# Patient Record
Sex: Female | Born: 1970 | Race: Black or African American | Hispanic: No | Marital: Married | Smoking: Former smoker
Health system: Southern US, Community
[De-identification: ages and names within clinical notes are randomized; demographics above are authoritative.]

## PROBLEM LIST (undated history)

## (undated) DIAGNOSIS — I10 Essential (primary) hypertension: Secondary | ICD-10-CM

## (undated) DIAGNOSIS — F32A Depression, unspecified: Secondary | ICD-10-CM

## (undated) DIAGNOSIS — Z720 Tobacco use: Secondary | ICD-10-CM

## (undated) DIAGNOSIS — F419 Anxiety disorder, unspecified: Secondary | ICD-10-CM

## (undated) DIAGNOSIS — R519 Headache, unspecified: Secondary | ICD-10-CM

---

## 2004-10-19 ENCOUNTER — Emergency Department: Payer: Self-pay | Admitting: Emergency Medicine

## 2006-06-28 ENCOUNTER — Emergency Department: Payer: Self-pay | Admitting: Emergency Medicine

## 2006-11-12 ENCOUNTER — Emergency Department: Payer: Self-pay | Admitting: Emergency Medicine

## 2007-02-03 ENCOUNTER — Observation Stay: Payer: Self-pay | Admitting: Otolaryngology

## 2008-02-27 ENCOUNTER — Other Ambulatory Visit: Payer: Self-pay

## 2008-02-27 ENCOUNTER — Emergency Department: Payer: Self-pay | Admitting: Emergency Medicine

## 2009-07-12 ENCOUNTER — Emergency Department: Payer: Self-pay | Admitting: Emergency Medicine

## 2009-12-18 ENCOUNTER — Emergency Department: Payer: Self-pay | Admitting: Emergency Medicine

## 2010-08-19 ENCOUNTER — Emergency Department: Payer: Self-pay | Admitting: Emergency Medicine

## 2011-07-07 ENCOUNTER — Emergency Department: Payer: Self-pay | Admitting: *Deleted

## 2012-01-12 ENCOUNTER — Emergency Department: Payer: Self-pay | Admitting: Emergency Medicine

## 2012-08-03 ENCOUNTER — Emergency Department: Payer: Self-pay | Admitting: Emergency Medicine

## 2013-06-24 ENCOUNTER — Emergency Department: Payer: Self-pay | Admitting: Emergency Medicine

## 2013-06-25 LAB — COMPREHENSIVE METABOLIC PANEL
Albumin: 3.5 g/dL (ref 3.4–5.0)
BUN: 11 mg/dL (ref 7–18)
Bilirubin,Total: 0.2 mg/dL (ref 0.2–1.0)
Calcium, Total: 8.7 mg/dL (ref 8.5–10.1)
Chloride: 106 mmol/L (ref 98–107)
Co2: 26 mmol/L (ref 21–32)
Creatinine: 0.83 mg/dL (ref 0.60–1.30)
EGFR (African American): >60
EGFR (Non-African Amer.): >60
Osmolality: 276 (ref 275–301)
Potassium: 3 mmol/L — ABNORMAL LOW (ref 3.5–5.1)
SGOT(AST): 25 U/L (ref 15–37)
Total Protein: 7.2 g/dL (ref 6.4–8.2)

## 2013-06-25 LAB — URINALYSIS, COMPLETE
Bilirubin,UR: NEGATIVE
Glucose,UR: NEGATIVE mg/dL (ref 0–75)
Ketone: NEGATIVE
Ph: 5 (ref 4.5–8.0)
RBC,UR: 301 /HPF (ref 0–5)
Specific Gravity: 1.005 (ref 1.003–1.030)
Squamous Epithelial: 2

## 2013-06-25 LAB — TROPONIN I: Troponin-I: <0.02 ng/mL

## 2013-06-25 LAB — CBC
HCT: 41 % (ref 35.0–47.0)
HGB: 13.9 g/dL (ref 12.0–16.0)
MCHC: 34 g/dL (ref 32.0–36.0)
MCV: 88 fL (ref 80–100)
Platelet: 304 10*3/uL (ref 150–440)
RBC: 4.69 10*6/uL (ref 3.80–5.20)
RDW: 14.1 % (ref 11.5–14.5)
WBC: 9.2 10*3/uL (ref 3.6–11.0)

## 2013-10-23 ENCOUNTER — Emergency Department: Payer: Self-pay | Admitting: Emergency Medicine

## 2013-10-23 LAB — BASIC METABOLIC PANEL
Anion Gap: 5 — ABNORMAL LOW (ref 7–16)
BUN: 12 mg/dL (ref 7–18)
CALCIUM: 9 mg/dL (ref 8.5–10.1)
CO2: 24 mmol/L (ref 21–32)
Chloride: 105 mmol/L (ref 98–107)
Creatinine: 0.79 mg/dL (ref 0.60–1.30)
EGFR (African American): >60
GLUCOSE: 89 mg/dL (ref 65–99)
Osmolality: 267 (ref 275–301)
Potassium: 3.6 mmol/L (ref 3.5–5.1)
Sodium: 134 mmol/L — ABNORMAL LOW (ref 136–145)

## 2013-10-23 LAB — CBC
HCT: 44.2 % (ref 35.0–47.0)
HGB: 14.7 g/dL (ref 12.0–16.0)
MCH: 29 pg (ref 26.0–34.0)
MCHC: 33.2 g/dL (ref 32.0–36.0)
MCV: 88 fL (ref 80–100)
Platelet: 262 10*3/uL (ref 150–440)
RBC: 5.05 10*6/uL (ref 3.80–5.20)
RDW: 14.2 % (ref 11.5–14.5)
WBC: 8.9 10*3/uL (ref 3.6–11.0)

## 2013-10-23 LAB — TROPONIN I

## 2014-07-22 ENCOUNTER — Emergency Department: Payer: Self-pay | Admitting: Emergency Medicine

## 2015-04-28 IMAGING — CR DG CHEST 2V
1 series · 2 of 2 positions shown · non-contrast
Comparison: Chest radiograph June 25, 2013

CLINICAL DATA: Heavy breathing, elevated blood pressure.

EXAM:
CHEST  2 VIEW

[Series 1: w chest pa · 0.14mm/px · 2 of 2 slices shown]
[im 1/2]
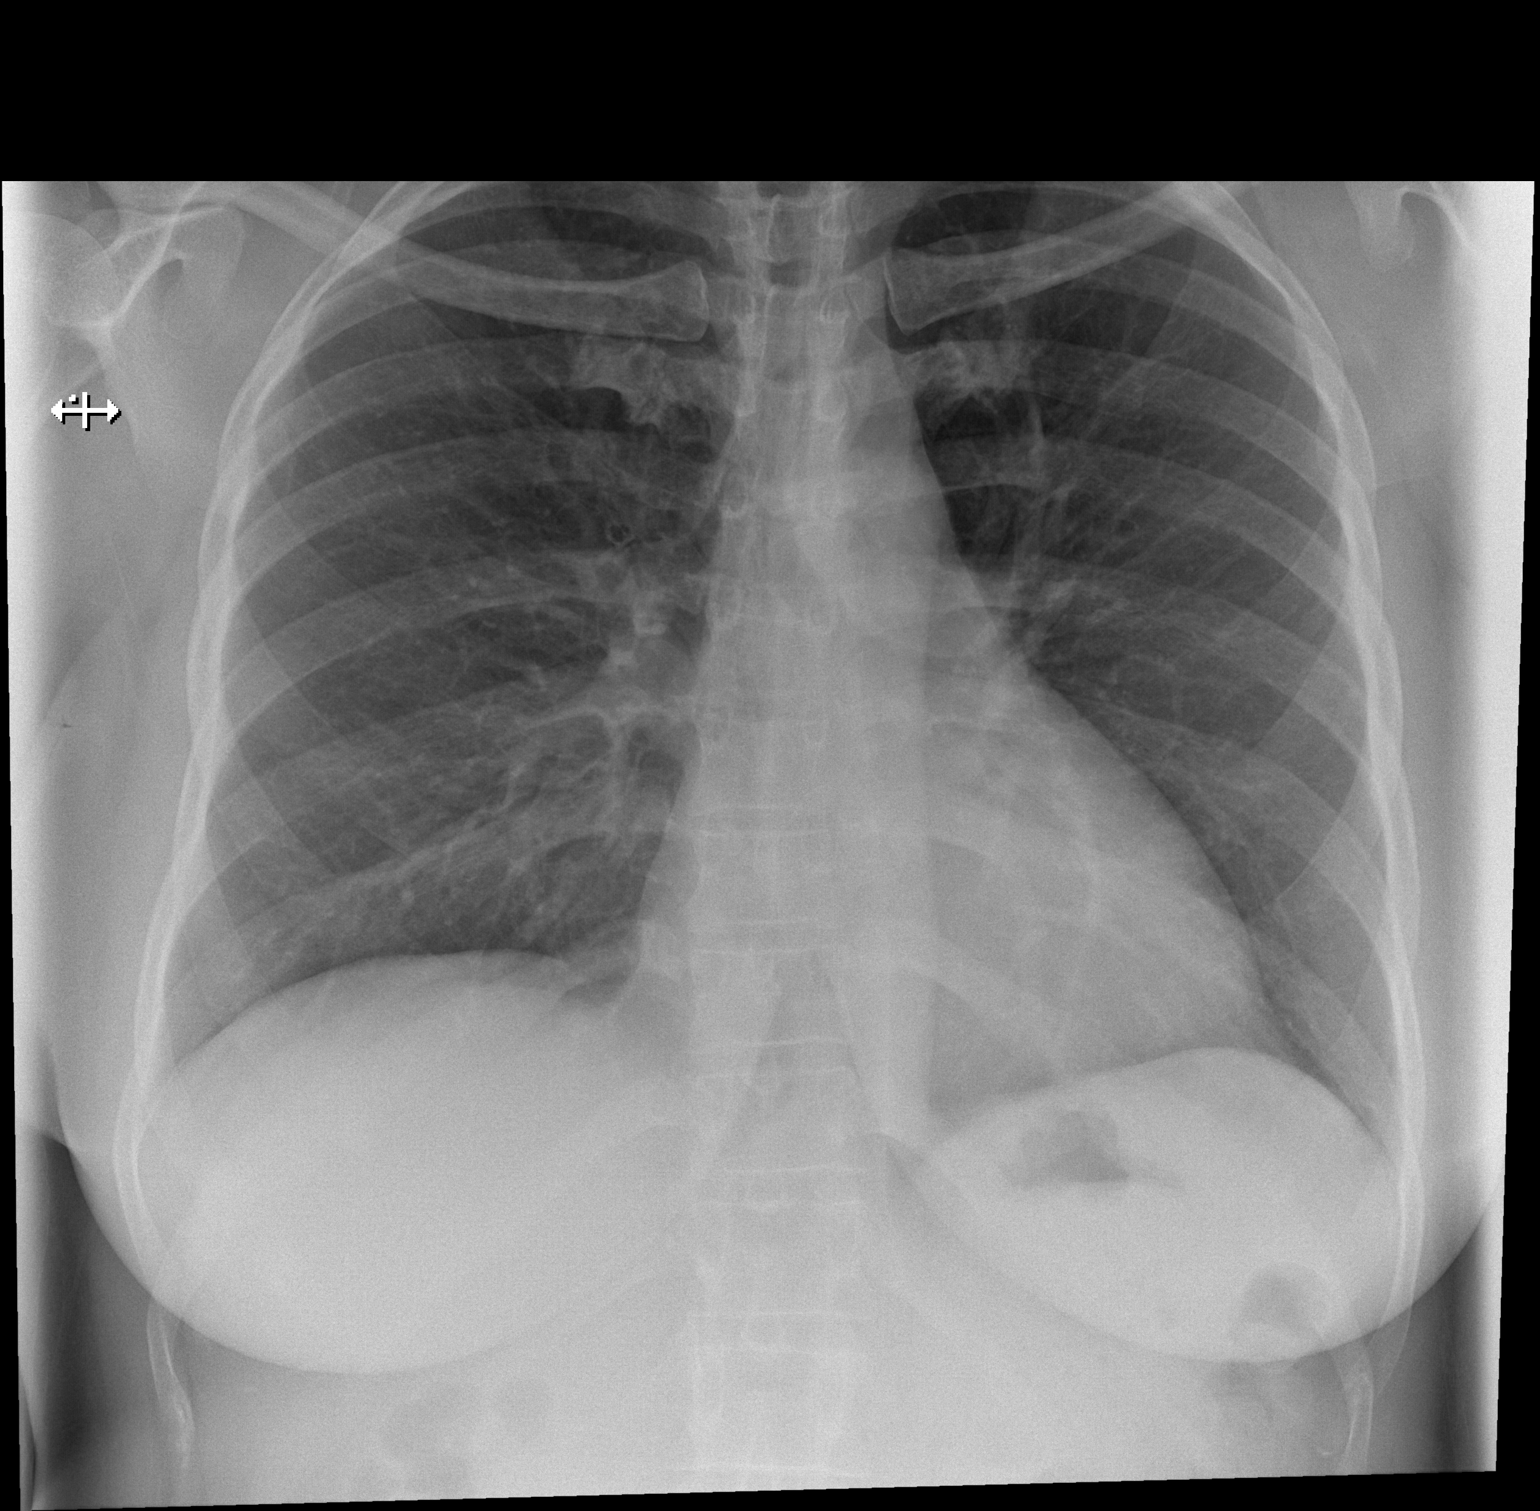
[im 2/2]
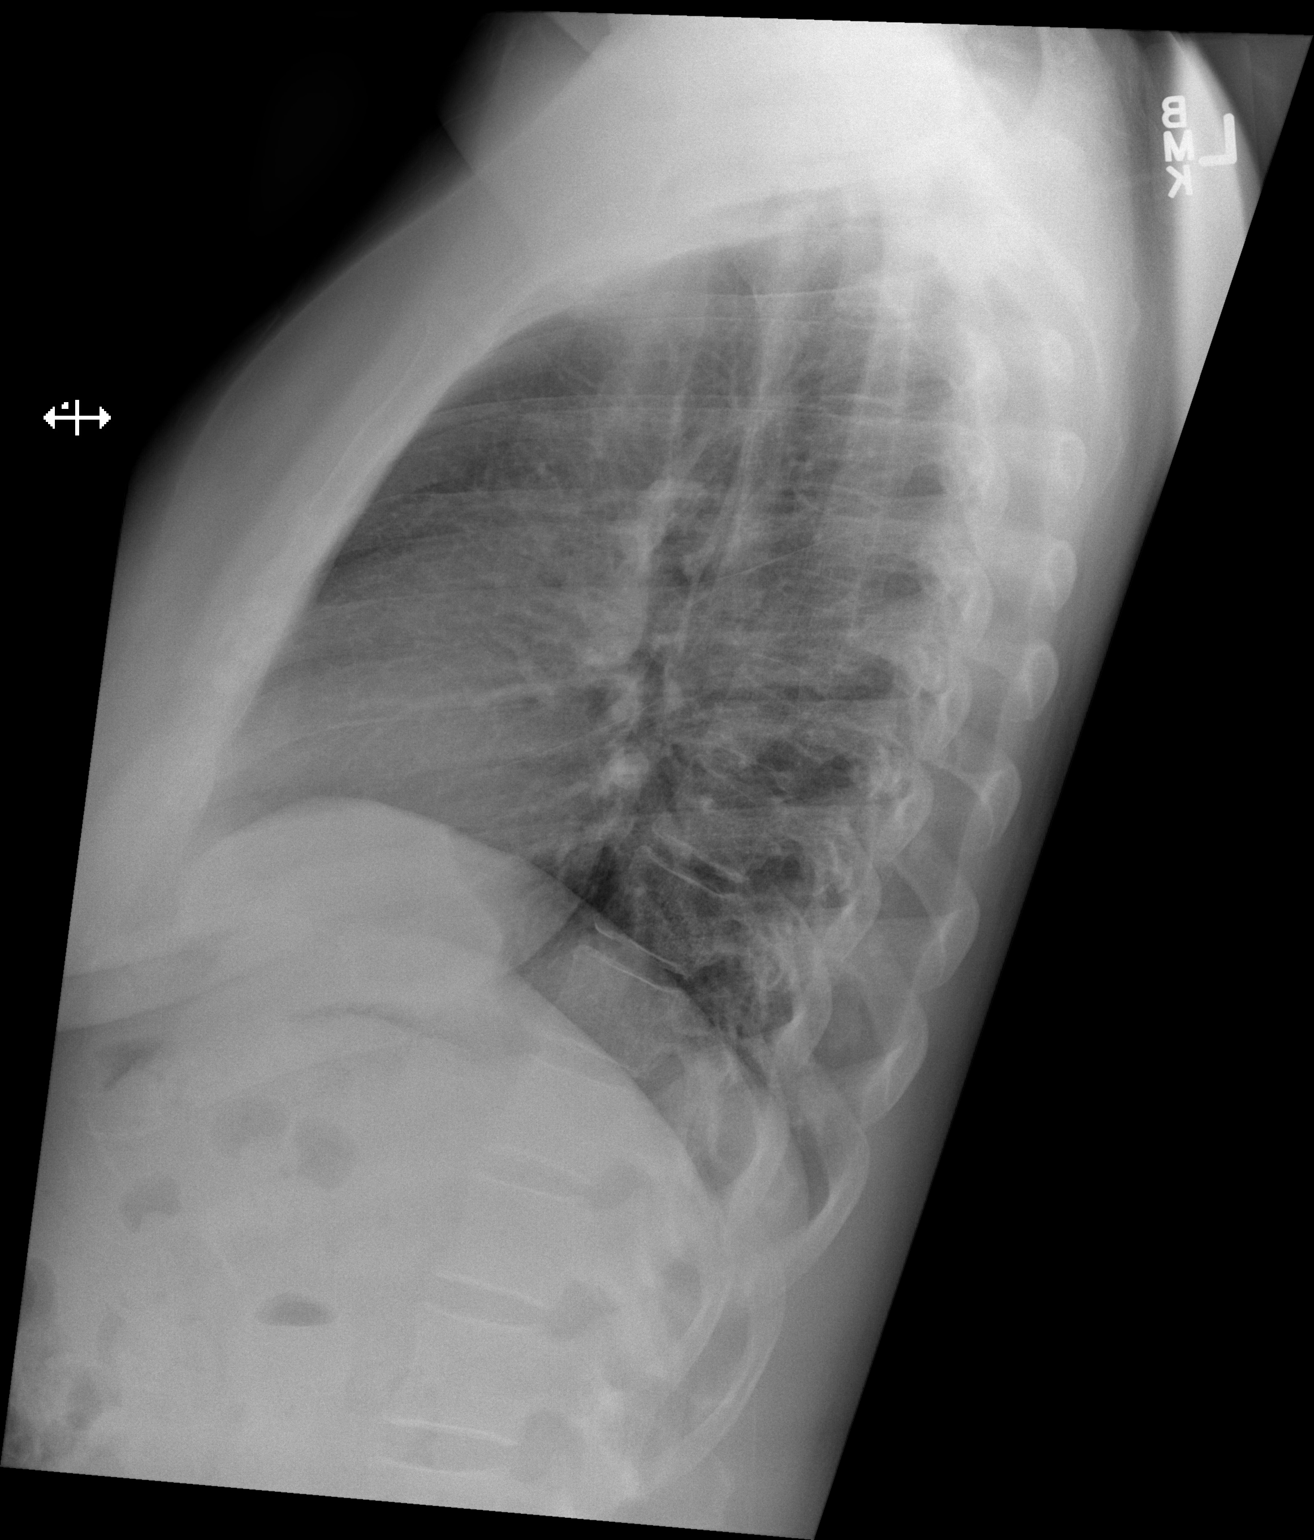

[2 of 2 positions shown; findings below may reference images not displayed]

FINDINGS: The cardiac silhouette appears mildly enlarged, mediastinal
silhouette is unremarkable. The lungs are clear without pleural
effusions or focal consolidations. Pulmonary vasculature is
unremarkable. Trachea projects midline and there is no pneumothorax.
Soft tissue planes and included osseous structures are
nonsuspicious.
IMPRESSION: Mild cardiomegaly, no acute pulmonary process; overall stable
appearance of the chest from June 25, 2013.

  By: Jhonn Galaviz

## 2016-03-16 ENCOUNTER — Encounter: Payer: Self-pay | Admitting: Emergency Medicine

## 2016-03-16 ENCOUNTER — Emergency Department
Admission: EM | Admit: 2016-03-16 | Discharge: 2016-03-16 | Disposition: A | Discharge status: Home / Self Care | Payer: Self-pay | Attending: Emergency Medicine | Admitting: Emergency Medicine

## 2016-03-16 DIAGNOSIS — F1290 Cannabis use, unspecified, uncomplicated: Secondary | ICD-10-CM | POA: Insufficient documentation

## 2016-03-16 DIAGNOSIS — Z5321 Procedure and treatment not carried out due to patient leaving prior to being seen by health care provider: Secondary | ICD-10-CM | POA: Insufficient documentation

## 2016-03-16 DIAGNOSIS — Z87891 Personal history of nicotine dependence: Secondary | ICD-10-CM | POA: Insufficient documentation

## 2016-03-16 HISTORY — DX: Essential (primary) hypertension: I10

## 2016-03-16 NOTE — ED Notes (Signed)
Notified by Pt Relations that pt is leaving d/t "the long wait" and going to [facility]. MD and Charge RN made aware, pt to be disposed as LWBS.

## 2016-03-16 NOTE — ED Notes (Signed)
Pt presents to ED with c/o frontal HA x5 days; pt reports pain as a pressure behind her forehead. Pt also reports abdominal pain yesterday w/ N/V that has since resolved (pt states it might have been constipation). Pt reports being d/x'd with HTN, but being non-complaint with medications since early 2016.  Pt is A&O, in NAD, talking and laughing with visitor at bedside.

## 2016-03-16 NOTE — ED Notes (Signed)
Patient ambulatory to triage with steady gait, without difficulty or distress noted; pt reports sent from Urgent Care for frontal HA x 5 days with elevated BP; st hx of same and has been off BP meds x year; taking ibuprofen/claritin/goody powder without relief

## 2016-03-16 NOTE — ED Notes (Signed)
Patient sent over from Urgent Care for elevated blood pressure of 240/110. Patient with complaints of a headache as well.

## 2016-03-17 ENCOUNTER — Telehealth: Payer: Self-pay | Admitting: Emergency Medicine

## 2016-03-17 NOTE — ED Notes (Signed)
Called patient due to lwot to inquire about condition and follow up plans.  Person who answered says pt is asleep.  i told him I was calling tocheck on her.  He says she was going to make a doctor appt for her headache.  i told him to have her call me at the ED if she needs help finding follow up.Angela Cain  He agrees.

## 2018-05-08 ENCOUNTER — Other Ambulatory Visit: Payer: Self-pay | Admitting: Primary Care

## 2018-05-08 DIAGNOSIS — D259 Leiomyoma of uterus, unspecified: Secondary | ICD-10-CM

## 2018-05-15 ENCOUNTER — Ambulatory Visit
Admission: RE | Admit: 2018-05-15 | Discharge: 2018-05-15 | Disposition: A | Discharge status: Home / Self Care | Payer: 59 | Source: Ambulatory Visit | Attending: Primary Care | Admitting: Primary Care

## 2018-05-15 DIAGNOSIS — D259 Leiomyoma of uterus, unspecified: Secondary | ICD-10-CM | POA: Diagnosis not present

## 2018-10-26 ENCOUNTER — Telehealth: Payer: Self-pay

## 2018-10-26 ENCOUNTER — Emergency Department: Payer: 59

## 2018-10-26 ENCOUNTER — Other Ambulatory Visit: Payer: Self-pay

## 2018-10-26 ENCOUNTER — Emergency Department
Admission: EM | Admit: 2018-10-26 | Discharge: 2018-10-26 | Disposition: A | Discharge status: Home / Self Care | Payer: 59 | Attending: Emergency Medicine | Admitting: Emergency Medicine

## 2018-10-26 DIAGNOSIS — Z87891 Personal history of nicotine dependence: Secondary | ICD-10-CM | POA: Insufficient documentation

## 2018-10-26 DIAGNOSIS — R053 Chronic cough: Secondary | ICD-10-CM

## 2018-10-26 DIAGNOSIS — R05 Cough: Secondary | ICD-10-CM | POA: Diagnosis not present

## 2018-10-26 DIAGNOSIS — E876 Hypokalemia: Secondary | ICD-10-CM | POA: Insufficient documentation

## 2018-10-26 DIAGNOSIS — I471 Supraventricular tachycardia: Secondary | ICD-10-CM

## 2018-10-26 DIAGNOSIS — I10 Essential (primary) hypertension: Secondary | ICD-10-CM | POA: Diagnosis not present

## 2018-10-26 DIAGNOSIS — R002 Palpitations: Secondary | ICD-10-CM | POA: Diagnosis present

## 2018-10-26 LAB — TSH: TSH: 1.182 u[IU]/mL (ref 0.350–4.500)

## 2018-10-26 LAB — BASIC METABOLIC PANEL
ANION GAP: 8 (ref 5–15)
BUN: 12 mg/dL (ref 6–20)
CALCIUM: 8.6 mg/dL — AB (ref 8.9–10.3)
CO2: 25 mmol/L (ref 22–32)
Chloride: 102 mmol/L (ref 98–111)
Creatinine, Ser: 0.76 mg/dL (ref 0.44–1.00)
GFR calc non Af Amer: >60 mL/min (ref >60)
Glucose, Bld: 124 mg/dL — ABNORMAL HIGH (ref 70–99)
Potassium: 2.6 mmol/L — CL (ref 3.5–5.1)
Sodium: 135 mmol/L (ref 135–145)

## 2018-10-26 LAB — CBC
HCT: 37.2 % (ref 36.0–46.0)
Hemoglobin: 11.7 g/dL — ABNORMAL LOW (ref 12.0–15.0)
MCH: 25.2 pg — ABNORMAL LOW (ref 26.0–34.0)
MCHC: 31.5 g/dL (ref 30.0–36.0)
MCV: 80.2 fL (ref 80.0–100.0)
PLATELETS: 411 10*3/uL — AB (ref 150–400)
RBC: 4.64 MIL/uL (ref 3.87–5.11)
RDW: 16.1 % — ABNORMAL HIGH (ref 11.5–15.5)
WBC: 11.3 10*3/uL — ABNORMAL HIGH (ref 4.0–10.5)
nRBC: 0 % (ref 0.0–0.2)

## 2018-10-26 LAB — POTASSIUM: Potassium: 3.5 mmol/L (ref 3.5–5.1)

## 2018-10-26 LAB — TROPONIN I: Troponin I: <0.03 ng/mL (ref <0.03)

## 2018-10-26 LAB — MAGNESIUM: Magnesium: 2 mg/dL (ref 1.7–2.4)

## 2018-10-26 MED ORDER — POTASSIUM CHLORIDE CRYS ER 20 MEQ PO TBCR
40.0000 meq | EXTENDED_RELEASE_TABLET | Freq: Once | ORAL | Status: AC
  Administered 2018-10-26: 40 meq via ORAL
  Filled 2018-10-26: qty 2

## 2018-10-26 MED ORDER — POTASSIUM CHLORIDE ER 10 MEQ PO TBCR: 40.0000 meq | EXTENDED_RELEASE_TABLET | Freq: Two times a day (BID) | ORAL | 0 refills | Status: DC

## 2018-10-26 MED ORDER — KETOROLAC TROMETHAMINE 10 MG PO TABS
10.0000 mg | ORAL_TABLET | Freq: Once | ORAL | Status: AC
  Administered 2018-10-26: 10 mg via ORAL
  Filled 2018-10-26: qty 1

## 2018-10-26 MED ORDER — SODIUM CHLORIDE 0.9 % IV BOLUS
1000.0000 mL | Freq: Once | INTRAVENOUS | Status: AC
  Administered 2018-10-26: 1000 mL via INTRAVENOUS

## 2018-10-26 MED ORDER — METOPROLOL TARTRATE 25 MG PO TABS: 25.0000 mg | ORAL_TABLET | Freq: Two times a day (BID) | ORAL | 0 refills | Status: DC

## 2018-10-26 MED ORDER — METOPROLOL TARTRATE 5 MG/5ML IV SOLN
10.0000 mg | Freq: Once | INTRAVENOUS | Status: AC
  Administered 2018-10-26: 10 mg via INTRAVENOUS
  Filled 2018-10-26: qty 10

## 2018-10-26 MED ORDER — POTASSIUM CHLORIDE 10 MEQ/100ML IV SOLN
10.0000 meq | INTRAVENOUS | Status: AC
  Administered 2018-10-26 (×3): 10 meq via INTRAVENOUS
  Filled 2018-10-26 (×3): qty 100

## 2018-10-26 NOTE — Telephone Encounter (Signed)
-----   Message from Minna Merritts, MD sent at 10/26/2018  2:07 PM EST ----- Needs new pt appt for tachycardia Rate 150 Thx TG

## 2018-10-26 NOTE — ED Triage Notes (Signed)
Pt reports tachycardia - pt states this is a recureent problem Reports shortness of breath but denies chest pain , dizziness, lightheadedness, N?V

## 2018-10-26 NOTE — ED Provider Notes (Addendum)
[facility] Emergency Department Provider Note  ____________________________________________  Time seen: Approximately 2:01 PM  I have reviewed the triage vital signs and the nursing notes.   HISTORY  Chief Complaint Tachycardia    HPI Angela Cain is a 48 y.o. female with a history of HTN not on medications presenting for palpitations.  The patient reports that she was at work when she felt like she had to sit down because her heart was going too fast.  She does drink sweet tea daily.  She states she has episodes like this 1-2 times per year for the last several years but has never sought medical attention for it.  She did not have any associated lightheadedness, syncope, chest pain, shortness of breath.  She has been under treatment by her PMD for chronic cough, including treatment for infection, seasonal allergies and reflux with persistent cough for the past year, but has no new symptoms from the standpoint.  At this time, the patient is feeling better.  Past Medical History:  Diagnosis Date  . Hypertension     There are no active problems to display for this patient.   History reviewed. No pertinent surgical history.    Allergies Patient has no known allergies.  No family history on file.  Social History Social History   Tobacco Use  . Smoking status: Former Smoker    Types: Cigarettes  . Smokeless tobacco: Never Used  Substance Use Topics  . Alcohol use: Yes    Comment: rarely  . Drug use: Yes    Types: Marijuana    Review of Systems Constitutional: No fever/chills.  No lightheadedness or syncope. Eyes: No visual changes. ENT: No sore throat. No congestion or rhinorrhea. Cardiovascular: Denies chest pain.  Positive palpitations. Respiratory: Denies shortness of breath.  No cough. Gastrointestinal: No abdominal pain.  No nausea, no vomiting.  No diarrhea.  No constipation. Genitourinary: Negative for dysuria. Musculoskeletal:  Negative for back pain.  No lower extremity swelling or calf pain. Skin: Negative for rash. Neurological: Negative for headaches. No focal numbness, tingling or weakness.     ____________________________________________   PHYSICAL EXAM:  VITAL SIGNS: ED Triage Vitals  Enc Vitals Group     BP 10/26/18 1323 (!) 144/98     Pulse Rate 10/26/18 1323 (!) 164     Resp 10/26/18 1323 20     Temp --      Temp src --      SpO2 10/26/18 1323 100 %     Weight 10/26/18 1321 145 lb (65.8 kg)     Height 10/26/18 1321 5\' 3"  (1.6 m)     Head Circumference --      Peak Flow --      Pain Score 10/26/18 1320 0     Pain Loc --      Pain Edu? --      Excl. in [place]? --     Constitutional: Alert and oriented. Answers questions appropriately. Eyes: Conjunctivae are normal.  EOMI. No scleral icterus. Head: Atraumatic. Nose: No congestion/rhinnorhea. Mouth/Throat: Mucous membranes are moist.  Neck: No stridor.  Supple.  No JVD.  No meningismus. Cardiovascular: Normal rate, regular rhythm. No murmurs, rubs or gallops.  Heart rate is 96 on my examination. Respiratory: Normal respiratory effort.  No accessory muscle use or retractions. Lungs CTAB.  No wheezes, rales or ronchi. Gastrointestinal: Soft, nontender and nondistended.  No guarding or rebound.  No peritoneal signs. Musculoskeletal: No LE edema. No ttp in the  calves or palpable cords.  Negative Homan's sign. Neurologic:  A&Ox3.  Speech is clear.  Face and smile are symmetric.  EOMI.  Moves all extremities well. Skin:  Skin is warm, dry and intact. No rash noted. Psychiatric: Mood and affect are normal. Speech and behavior are normal.  Normal judgement  ____________________________________________   LABS (all labs ordered are listed, but only abnormal results are displayed)  Labs Reviewed  CBC - Abnormal; Notable for the following components:      Result Value   WBC 11.3 (*)    Hemoglobin 11.7 (*)    MCH 25.2 (*)    RDW 16.1 (*)     Platelets 411 (*)    All other components within normal limits  BASIC METABOLIC PANEL  TROPONIN I  TSH  POC URINE PREG, ED   ____________________________________________  EKG  ED ECG REPORT I, Anne-Caroline Mariea Clonts, the attending physician, personally viewed and interpreted this ECG.   Date: 10/26/2018  EKG Time: 1320  Rate: 151  Rhythm: SVT; LVH  Axis: normal  Intervals:none  ST&T Change: No STEMI; patient has 0.5 mm of ST elevation in V1 and aVR with 0.5 to 1 mm of ST depression in 2 and 3.  Most consistent with early repolarization.  Repeat EKG: ED ECG REPORT I, Anne-Caroline Mariea Clonts, the attending physician, personally viewed and interpreted this ECG.   Date: 10/26/2018  EKG Time: 14443  Rate: 86  Rhythm: normal sinus rhythm  Axis: normal  Intervals:none  ST&T Change: No STEMI   ____________________________________________  RADIOLOGY  Dg Chest 2 View  Result Date: 10/26/2018 CLINICAL DATA:  Tachycardia.  Shortness of breath EXAM: CHEST - 2 VIEW COMPARISON:  October 23, 2013 FINDINGS: The lungs are clear. The heart size and pulmonary vascularity are normal. No adenopathy. No pneumothorax. There is slight lower thoracic dextroscoliosis. IMPRESSION: No edema or consolidation. Electronically Signed   By: Lowella Grip III M.D.   On: 10/26/2018 13:51    ____________________________________________   PROCEDURES  Procedure(s) performed: None  Procedures  Critical Care performed: Yes, see critical care note(s) ____________________________________________   INITIAL IMPRESSION / ASSESSMENT AND PLAN / ED COURSE  Pertinent labs & imaging results that were available during my care of the patient were reviewed by me and considered in my medical decision making (see chart for details).  48 y.o. female with a history of hypertension not currently treated, caffeine drinker, presenting with 1-2 yearly episodes of fast heart rate.  Today, the patient developed a fast  heart rate without any other red flag symptoms.  On upon arrival to the emergency department, she is hypertensive with fast heart rate in the 150s, most consistent with SVT.  She does have some minimal ST elevations and depressions but no STEMI.  I will plan to evaluate her electrolytes, thyroid panel.  She has spontaneously slowed her rate down into the 90s, and I will give her a beta-blocker to prevent resurgence of her SVT.    I spoke with Dr. Rockey Situ, the cardiologist on-call, who recommends daily metoprolol tartrate twice daily with outpatient evaluation.  I have let the patient know about the follow-up plan and described return precautions.  ----------------------------------------- 2:29 PM on 10/26/2018 -----------------------------------------  The patient's electrolytes have resulted and she does have a potassium of 2.6.  I have ordered oral and IV supplementation, and we will recheck her potassium to make sure it is upward trending.  She will be discharged home with a 4-day course of oral potassium with instructions  to have her PCP or Dr. Rockey Situ recheck her potassium as an outpatient.   CRITICAL CARE Performed by: Eula Listen   Total critical care time: 35 minutes  Critical care time was exclusive of separately billable procedures and treating other patients.  Critical care was necessary to treat or prevent imminent or life-threatening deterioration.  Critical care was time spent personally by me on the following activities: development of treatment plan with patient and/or surrogate as well as nursing, discussions with consultants, evaluation of patient's response to treatment, examination of patient, obtaining history from patient or surrogate, ordering and performing treatments and interventions, ordering and review of laboratory studies, ordering and review of radiographic studies, pulse oximetry and re-evaluation of patient's  condition.   ____________________________________________  FINAL CLINICAL IMPRESSION(S) / ED DIAGNOSES  Final diagnoses:  SVT (supraventricular tachycardia) (HCC)  Chronic cough         NEW MEDICATIONS STARTED DURING THIS VISIT:  New Prescriptions   METOPROLOL TARTRATE (LOPRESSOR) 25 MG TABLET    Take 1 tablet (25 mg total) by mouth 2 (two) times daily.      Eula Listen, MD 10/26/18 1423    Eula Listen, MD 10/26/18 [phone]

## 2018-10-26 NOTE — Telephone Encounter (Signed)
Attempted to schedule new patient appt. No ans no vm . Will reach out another time.

## 2018-10-26 NOTE — ED Notes (Signed)
Date and time results received: 10/26/18 2:26 PM    Test: Potassium Critical Value: 2.6  Name of Provider Notified: Dr. Mariea Clonts

## 2018-10-26 NOTE — Discharge Instructions (Addendum)
Please stop drinking caffeine, including sweet tea.  Start taking metoprolol twice daily to prevent this fast heart rate.  Make an appoint with Dr. Rockey Situ, the cardiologist on-call, for follow-up.  Today, your potassium level was low and will need to be rechecked.  Please take the potassium tablets as prescribed, and have either your primary care physician or Dr. Rockey Situ repeat your potassium test.  Return to the emergency department if you develop severe pain, lightheadedness, palpitations, or any other symptoms concerning to you.

## 2018-10-26 NOTE — ED Notes (Signed)
Only received 1 bag of potassium from pharmacy, contacted pharmacy to check on delay and was told the other bags were ready and they would send another one now.

## 2019-01-30 ENCOUNTER — Encounter

## 2019-01-30 ENCOUNTER — Ambulatory Visit: Payer: 59 | Admitting: Cardiovascular Disease

## 2019-02-25 ENCOUNTER — Emergency Department
Admission: EM | Admit: 2019-02-25 | Discharge: 2019-02-25 | Disposition: A | Discharge status: Home / Self Care | Payer: 59 | Attending: Emergency Medicine | Admitting: Emergency Medicine

## 2019-02-25 ENCOUNTER — Other Ambulatory Visit: Payer: Self-pay

## 2019-02-25 DIAGNOSIS — Z5321 Procedure and treatment not carried out due to patient leaving prior to being seen by health care provider: Secondary | ICD-10-CM | POA: Insufficient documentation

## 2019-02-25 DIAGNOSIS — T7840XA Allergy, unspecified, initial encounter: Secondary | ICD-10-CM | POA: Insufficient documentation

## 2019-02-25 NOTE — ED Triage Notes (Signed)
Pt c/o intermittent swelling of her top and bottom liip over the past 2 weeks,. States it will go down with benadryl but then swell back up. Denies any tongue or throat swelling.

## 2020-04-30 IMAGING — CR DG CHEST 2V
2 series · 2 of 2 positions shown · non-contrast
Comparison: October 23, 2013

CLINICAL DATA: Tachycardia.  Shortness of breath

EXAM:
CHEST - 2 VIEW

[chest pa]
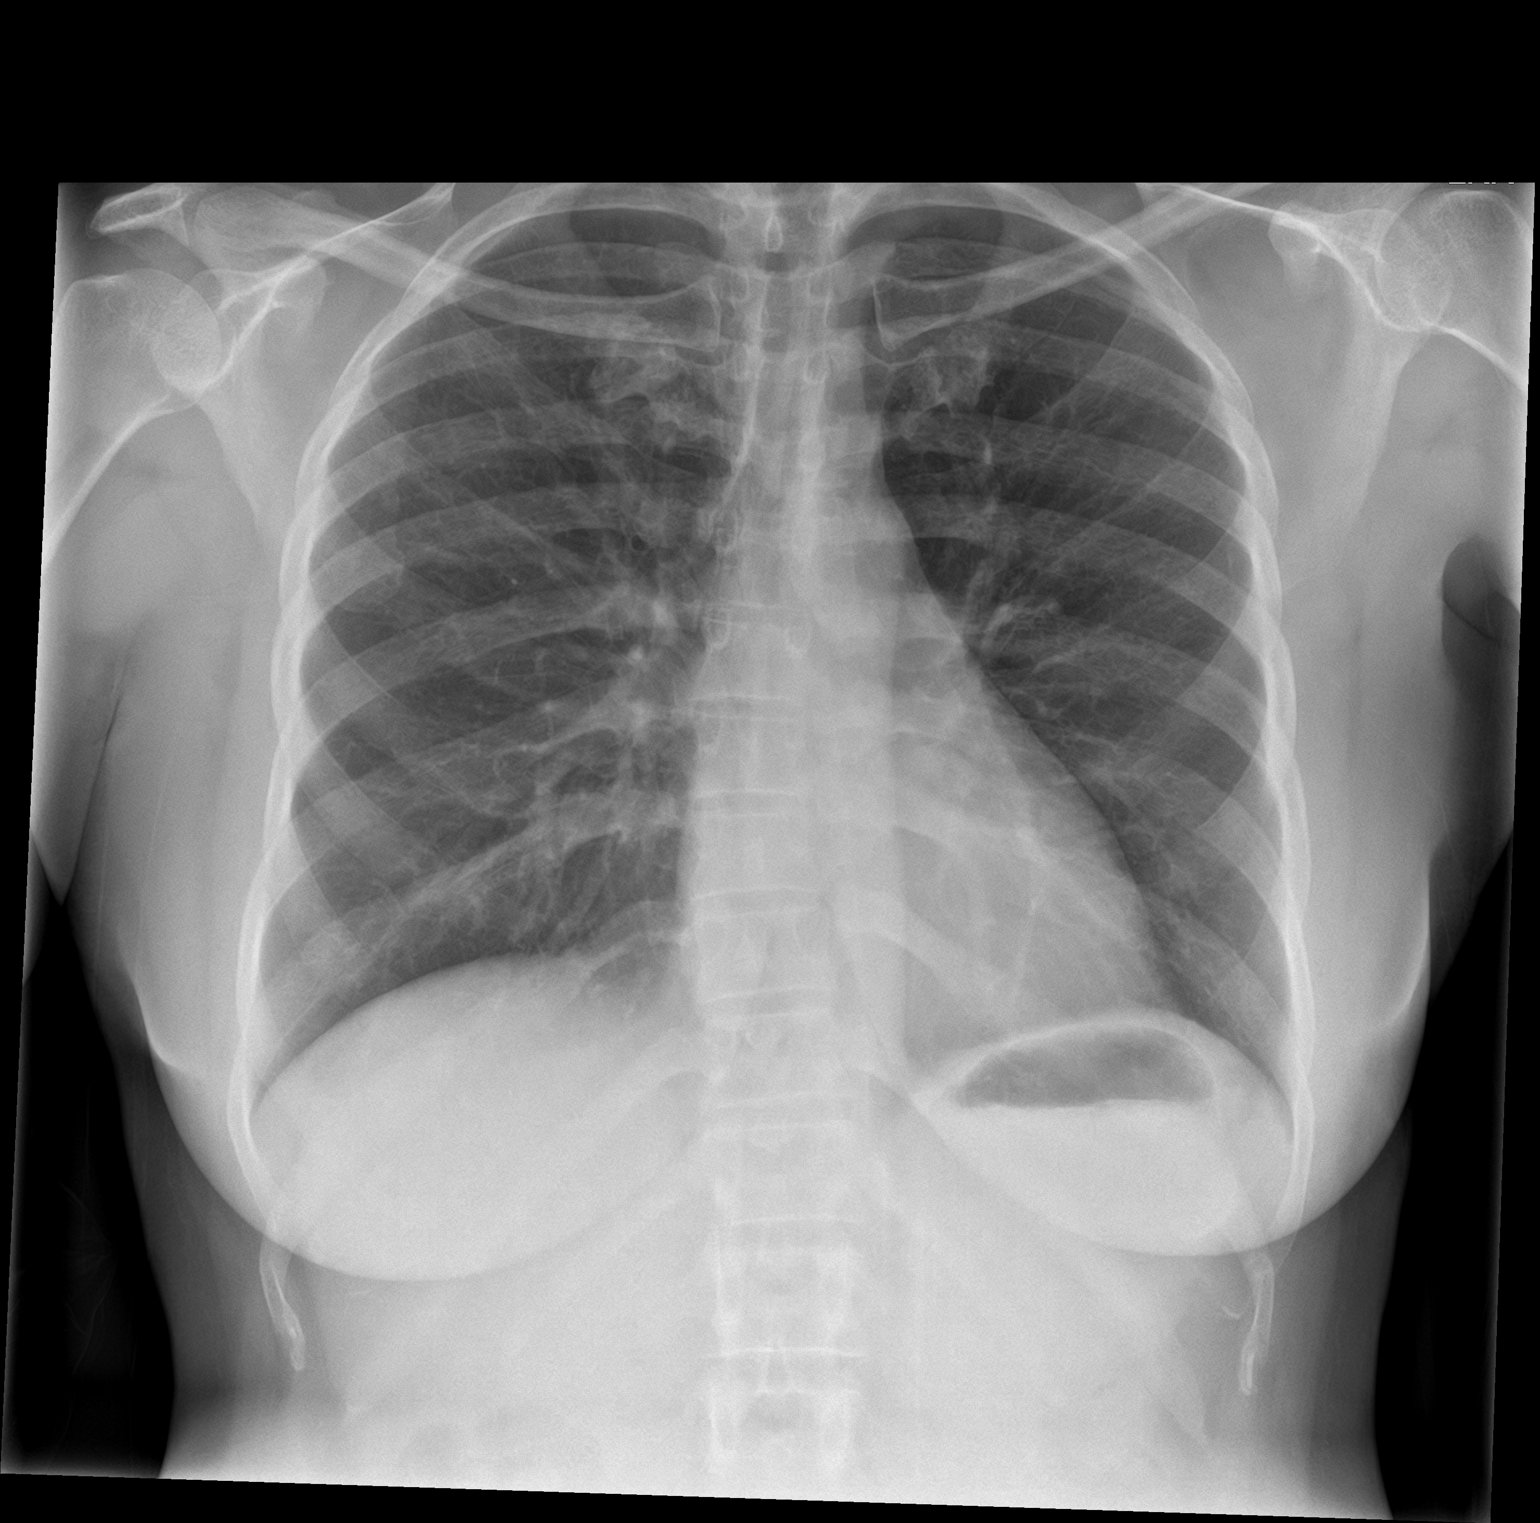

[chest lat]
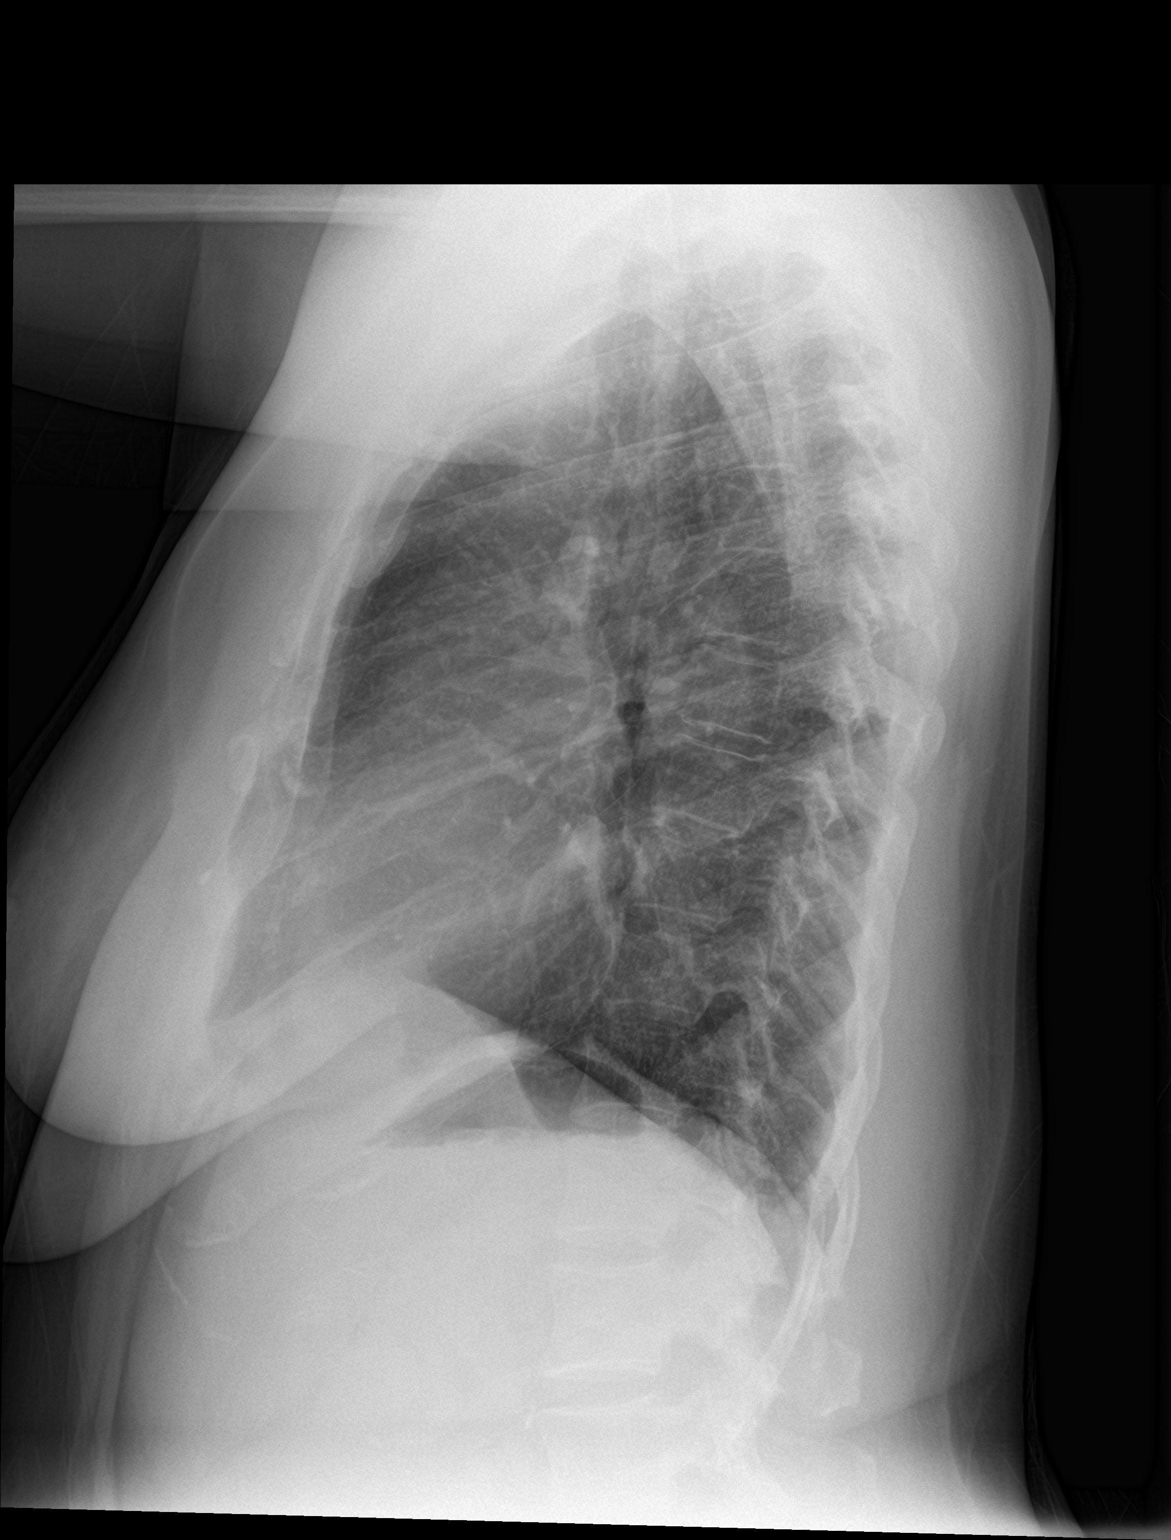

[2 of 2 positions shown; findings below may reference images not displayed]

FINDINGS: The lungs are clear. The heart size and pulmonary vascularity are
normal. No adenopathy. No pneumothorax. There is slight lower
thoracic dextroscoliosis.
IMPRESSION: No edema or consolidation.

## 2021-10-04 DIAGNOSIS — K5792 Diverticulitis of intestine, part unspecified, without perforation or abscess without bleeding: Secondary | ICD-10-CM

## 2021-10-04 DIAGNOSIS — E876 Hypokalemia: Secondary | ICD-10-CM

## 2021-10-04 DIAGNOSIS — K5780 Diverticulitis of intestine, part unspecified, with perforation and abscess without bleeding: Secondary | ICD-10-CM

## 2021-10-04 HISTORY — DX: Hypokalemia: E87.6

## 2021-10-04 HISTORY — DX: Diverticulitis of intestine, part unspecified, with perforation and abscess without bleeding: K57.80

## 2021-10-04 HISTORY — DX: Diverticulitis of intestine, part unspecified, without perforation or abscess without bleeding: K57.92

## 2021-12-03 ENCOUNTER — Other Ambulatory Visit: Payer: Self-pay

## 2021-12-03 ENCOUNTER — Emergency Department
Admission: EM | Admit: 2021-12-03 | Discharge: 2021-12-03 | Disposition: A | Discharge status: Home / Self Care | Payer: Commercial Managed Care - PPO | Attending: Emergency Medicine | Admitting: Emergency Medicine

## 2021-12-03 DIAGNOSIS — I10 Essential (primary) hypertension: Secondary | ICD-10-CM | POA: Insufficient documentation

## 2021-12-03 DIAGNOSIS — M79604 Pain in right leg: Secondary | ICD-10-CM | POA: Insufficient documentation

## 2021-12-03 NOTE — ED Provider Notes (Signed)
? ?[facility] ?Provider Note ? ? ? Event Date/Time  ? First MD Initiated Contact with Patient 12/03/21 [phone]   ?  (approximate) ? ? ?History  ? ?Chief Complaint ?Leg Pain ? ? ?HPI ?Angela Cain is a 51 y.o. female, history of hypertension, presents to the emergency department for evaluation of right leg pain.  Patient states that she has been waking up with burning right leg pain in the mornings for the past 2 weeks.  She states that the sensation only last for a couple minutes and then spontaneously goes away on its own with movement.  Denies fever/chills, recent injuries/illnesses, bladder/bowel incontinence, chest pain, shortness of breath, knee pain, calf pain, back pain, abdominal pain, or urinary symptoms ? ?History Limitations: No limitations. ? ?  ? ? ?Physical Exam  ?Triage Vital Signs: ?ED Triage Vitals  ?Enc Vitals Group  ?   BP 12/03/21 0941 (!) 144/99  ?   Pulse Rate 12/03/21 0941 92  ?   Resp 12/03/21 0941 18  ?   Temp 12/03/21 0941 98.4 ?F (36.9 ?C)  ?   Temp Source 12/03/21 0941 Oral  ?   SpO2 12/03/21 0941 97 %  ?   Weight 12/03/21 0937 149 lb 14.6 oz (68 kg)  ?   Height 12/03/21 0937 5\' 3"  (1.6 m)  ?   Head Circumference --   ?   Peak Flow --   ?   Pain Score --   ?   Pain Loc --   ?   Pain Edu? --   ?   Excl. in [place]? --   ? ? ?Most recent vital signs: ?Vitals:  ? 12/03/21 0941  ?BP: (!) 144/99  ?Pulse: 92  ?Resp: 18  ?Temp: 98.4 ?F (36.9 ?C)  ?SpO2: 97%  ? ? ?General: Awake, NAD.  ?CV: Good peripheral perfusion.  ?Resp: Normal effort.  ?Abd: Soft, non-tender. No distention.  ?Neuro: At baseline. No gross neurological deficits. ?Other: No gross deformities in the right lower extremity.  No contusions.  Pulse, motor, sensation intact.  No midline spinal tenderness.  Negative Homans' sign.  Negative straight leg test.  Normal color and perfusion. ? ?Physical Exam ? ? ? ?ED Results / Procedures / Treatments  ?Labs ?(all labs ordered are listed, but only abnormal  results are displayed) ?Labs Reviewed - No data to display ? ? ?EKG ?Not applicable. ? ? ?RADIOLOGY ? ?ED Provider Interpretation: Not applicable ? ?No results found. ? ?PROCEDURES: ? ?Critical Care performed: None. ? ?Procedures ? ? ? ?MEDICATIONS ORDERED IN ED: ?Medications - No data to display ? ? ?IMPRESSION / MDM / ASSESSMENT AND PLAN / ED COURSE  ?I reviewed the triage vital signs and the nursing notes. ?             ?               ? ?Differential diagnosis includes, but is not limited to, DVT, sciatica, nerve palsy.  ? ?ED Course ?Patient appears well.  Vital signs within normal limits.  NAD ? ?Assessment/Plan ?Presentation consistent with sciatica versus self-limiting peripheral nerve palsy.  Patient does not have any risk factors or physical exam findings concerning for DVT.  She is currently asymptomatic.  She is able to ambulate without any issue.  She states that the sensation goes away after couple minutes.  No endorsement of bowel/bladder incontinence or saddle anesthesia. We will plan to discharge this patient with a referral to orthopedics  if sensation continues despite conservative management. ? ?Patient was provided with anticipatory guidance, return precautions, and educational material. Encouraged the patient to return to the emergency department at any time if they begin to experience any new or worsening symptoms.  ? ?  ? ? ?FINAL CLINICAL IMPRESSION(S) / ED DIAGNOSES  ? ?Final diagnoses:  ?Right leg pain  ? ? ? ?Rx / DC Orders  ? ?ED Discharge Orders   ? ? None  ? ?  ? ? ? ?Note:  This document was prepared using Dragon voice recognition software and may include unintentional dictation errors. ?  ?Teodoro Spray, [place] ?12/03/21 7711 ? ?  ?Nena Polio, MD ?12/03/21 1656 ? ?

## 2021-12-03 NOTE — ED Triage Notes (Signed)
Pt c/o waking with burning right leg pain in the mornings for the past 2 weeks and numbness to the left leg that last for about 3-4 minutes and then goes away. Pt is ambulatory with a steady gait on arrival, NAD noted at present. Skin is warm and dry. ?

## 2021-12-03 NOTE — Discharge Instructions (Addendum)
-  Take Tylenol/ibuprofen as needed for pain ?-Follow-up with your PCP or the orthopedist listed above if symptoms persist ?-Return to the emergency department at any time if you begin to experience any new or worsening symptoms. ?

## 2021-12-03 NOTE — ED Notes (Signed)
Pt here with severe burning to her right leg when she awakes in the morning that only last 2-3 mins then goes away. Pt states that 2 days ago her left leg started having numbness when she woke up and was concerned.  ?

## 2022-04-28 ENCOUNTER — Emergency Department
Admission: EM | Admit: 2022-04-28 | Discharge: 2022-04-28 | Disposition: A | Discharge status: Home / Self Care | Payer: Commercial Managed Care - PPO | Attending: Emergency Medicine | Admitting: Emergency Medicine

## 2022-04-28 ENCOUNTER — Other Ambulatory Visit: Payer: Self-pay

## 2022-04-28 DIAGNOSIS — M791 Myalgia, unspecified site: Secondary | ICD-10-CM | POA: Insufficient documentation

## 2022-04-28 DIAGNOSIS — I10 Essential (primary) hypertension: Secondary | ICD-10-CM | POA: Insufficient documentation

## 2022-04-28 DIAGNOSIS — M7918 Myalgia, other site: Secondary | ICD-10-CM

## 2022-04-28 LAB — URINALYSIS, ROUTINE W REFLEX MICROSCOPIC
Bacteria, UA: NONE SEEN
Bilirubin Urine: NEGATIVE
Glucose, UA: NEGATIVE mg/dL
Ketones, ur: NEGATIVE mg/dL
Leukocytes,Ua: NEGATIVE
Nitrite: NEGATIVE
Protein, ur: NEGATIVE mg/dL
Specific Gravity, Urine: 1.011 (ref 1.005–1.030)
pH: 7 (ref 5.0–8.0)

## 2022-04-28 LAB — CBC WITH DIFFERENTIAL/PLATELET
Abs Immature Granulocytes: 0.02 10*3/uL (ref 0.00–0.07)
Basophils Absolute: 0 10*3/uL (ref 0.0–0.1)
Basophils Relative: 1 %
Eosinophils Absolute: 0.2 10*3/uL (ref 0.0–0.5)
Eosinophils Relative: 2 %
HCT: 42.8 % (ref 36.0–46.0)
Hemoglobin: 14.4 g/dL (ref 12.0–15.0)
Immature Granulocytes: 0 %
Lymphocytes Relative: 38 %
Lymphs Abs: 2.8 10*3/uL (ref 0.7–4.0)
MCH: 29.1 pg (ref 26.0–34.0)
MCHC: 33.6 g/dL (ref 30.0–36.0)
MCV: 86.6 fL (ref 80.0–100.0)
Monocytes Absolute: 0.5 10*3/uL (ref 0.1–1.0)
Monocytes Relative: 6 %
Neutro Abs: 4 10*3/uL (ref 1.7–7.7)
Neutrophils Relative %: 53 %
Platelets: 271 10*3/uL (ref 150–400)
RBC: 4.94 MIL/uL (ref 3.87–5.11)
RDW: 14.5 % (ref 11.5–15.5)
WBC: 7.5 10*3/uL (ref 4.0–10.5)
nRBC: 0 % (ref 0.0–0.2)

## 2022-04-28 LAB — BASIC METABOLIC PANEL
Anion gap: 7 (ref 5–15)
BUN: 13 mg/dL (ref 6–20)
CO2: 23 mmol/L (ref 22–32)
Calcium: 8.9 mg/dL (ref 8.9–10.3)
Chloride: 107 mmol/L (ref 98–111)
Creatinine, Ser: 0.74 mg/dL (ref 0.44–1.00)
GFR, Estimated: >60 mL/min (ref >60)
Glucose, Bld: 96 mg/dL (ref 70–99)
Potassium: 3.5 mmol/L (ref 3.5–5.1)
Sodium: 137 mmol/L (ref 135–145)

## 2022-04-28 MED ORDER — AMLODIPINE BESYLATE 10 MG PO TABS: 10.0000 mg | ORAL_TABLET | Freq: Every day | ORAL | 0 refills | Status: DC

## 2022-04-28 MED ORDER — METOPROLOL TARTRATE 25 MG PO TABS
25.0000 mg | ORAL_TABLET | ORAL | Status: AC
  Administered 2022-04-28: 25 mg via ORAL
  Filled 2022-04-28: qty 1

## 2022-04-28 MED ORDER — METOPROLOL TARTRATE 25 MG PO TABS: 25.0000 mg | ORAL_TABLET | Freq: Two times a day (BID) | ORAL | 0 refills | Status: DC

## 2022-04-28 MED ORDER — AMLODIPINE BESYLATE 5 MG PO TABS
10.0000 mg | ORAL_TABLET | Freq: Once | ORAL | Status: AC
  Administered 2022-04-28: 10 mg via ORAL
  Filled 2022-04-28: qty 2

## 2022-04-28 NOTE — ED Provider Notes (Signed)
[facility] Provider Note    Event Date/Time   First MD Initiated Contact with Patient 04/28/22 1225     (approximate)   History   Leg Pain   HPI  Nazli G Suzan Nailer is a 51 y.o. female presents to the emergency department for treatment and evaluation of body pain, especially in both of her legs that has become chronic.  She had a episode today where she had to sit down in the floor because of the pain. She has not taken anything. She also states that she is out of her BP medication and would like a refill. Asymptomatic of hypertension.  Past Medical History:  Diagnosis Date   Hypertension      Physical Exam   Triage Vital Signs: ED Triage Vitals [04/28/22 1157]  Enc Vitals Group     BP (!) 198/134     Pulse Rate 90     Resp 18     Temp 97.8 F (36.6 C)     Temp Source Oral     SpO2 95 %     Weight 149 lb 14.6 oz (68 kg)     Height '5\' 3"'$  (1.6 m)     Head Circumference      Peak Flow      Pain Score 9     Pain Loc      Pain Edu?      Excl. in [place]?     Most recent vital signs: Vitals:   04/28/22 1157  BP: (!) 198/134  Pulse: 90  Resp: 18  Temp: 97.8 F (36.6 C)  SpO2: 95%    General: Awake, no distress.  CV:  Good peripheral perfusion.  Resp:  Normal effort.  Abd:  No distention.  Other:     ED Results / Procedures / Treatments   Labs (all labs ordered are listed, but only abnormal results are displayed) Labs Reviewed - No data to display   EKG  Not indicated.   RADIOLOGY  Not indicated/  I have independently reviewed and interpreted imaging as well as reviewed report from radiology.  PROCEDURES:  Critical Care performed: No  Procedures   MEDICATIONS ORDERED IN ED:  Medications - No data to display   IMPRESSION / MDM / [place] / ED COURSE   I reviewed the triage vital signs and the nursing notes.  Differential diagnosis includes, but is not limited to: electrolyte disturbance,  musculoskeletal pain, functional complaint, hypertension  Patient's presentation is most consistent with acute complicated illness / injury requiring diagnostic workup.  51 year old female presenting to the emergency department for treatment and evaluation of diffuse pain.  She states that she has been purchasing "other peoples pills" to treat this pain.  She denies any injury.  She denies any other known medical issue except hypertension for which she does not take her antihypertensives.  She is requesting a refill but denies any symptoms of chest pain, headache, blurred vision, dizziness.  Chart review shows that she has been on amlodipine and metoprolol in the past.  These were ordered for her to have while she is here.  I had wanted to get her pressure down with some IV medications, however the patient refused and states that she is not here for blood pressure control..  She was advised that uncontrolled hypertension may lead to stroke, cardiac event, and death.  She is to follow up with primary care and she was provided with a list of community resources.  She was encouraged to call to request an appointment.  For any symptom of concern before she is able to be evaluated by primary care, she is to return to the emergency department.      FINAL CLINICAL IMPRESSION(S) / ED DIAGNOSES   Final diagnoses:  None     Rx / DC Orders   ED Discharge Orders     None        Note:  This document was prepared using Dragon voice recognition software and may include unintentional dictation errors.   Victorino Dike, FNP 04/28/22 1523    Carrie Mew, MD 04/29/22 [phone]

## 2022-04-28 NOTE — Discharge Instructions (Signed)
Please take your medications every day as prescribed..  Be aware that if your blood pressure is not better controlled, you are at risk for having a stroke or heart issue.  Return to the emergency department for any symptom of concern.

## 2022-04-28 NOTE — ED Notes (Signed)
ED NP Vladimir Crofts made aware about pt BP. Pt requesting to leave, medication compliance education provided to pt

## 2022-04-28 NOTE — ED Triage Notes (Signed)
Pt here with pain in both legs for months. Pt was here recently in March and told she had sciatic nerve. Pt states pain has continued, pt states when she walks she feels a sharp pain run up her legs to her back. Pt ambulatory to triage.

## 2022-06-30 ENCOUNTER — Encounter: Payer: Self-pay | Admitting: Emergency Medicine

## 2022-06-30 ENCOUNTER — Emergency Department: Payer: Self-pay

## 2022-06-30 ENCOUNTER — Inpatient Hospital Stay
Admission: EM | Admit: 2022-06-30 | Discharge: 2022-07-01 | DRG: 392 | Disposition: A | Discharge status: Home / Self Care | Payer: Self-pay | Attending: Hospitalist | Admitting: Hospitalist

## 2022-06-30 ENCOUNTER — Other Ambulatory Visit: Payer: Self-pay

## 2022-06-30 DIAGNOSIS — R140 Abdominal distension (gaseous): Secondary | ICD-10-CM | POA: Diagnosis present

## 2022-06-30 DIAGNOSIS — K5732 Diverticulitis of large intestine without perforation or abscess without bleeding: Principal | ICD-10-CM | POA: Diagnosis present

## 2022-06-30 DIAGNOSIS — Z87891 Personal history of nicotine dependence: Secondary | ICD-10-CM

## 2022-06-30 DIAGNOSIS — E876 Hypokalemia: Secondary | ICD-10-CM

## 2022-06-30 DIAGNOSIS — I10 Essential (primary) hypertension: Secondary | ICD-10-CM | POA: Diagnosis present

## 2022-06-30 DIAGNOSIS — K5792 Diverticulitis of intestine, part unspecified, without perforation or abscess without bleeding: Secondary | ICD-10-CM | POA: Diagnosis present

## 2022-06-30 DIAGNOSIS — R1031 Right lower quadrant pain: Secondary | ICD-10-CM

## 2022-06-30 DIAGNOSIS — Z79899 Other long term (current) drug therapy: Secondary | ICD-10-CM

## 2022-06-30 DIAGNOSIS — D259 Leiomyoma of uterus, unspecified: Secondary | ICD-10-CM | POA: Diagnosis present

## 2022-06-30 DIAGNOSIS — Z91013 Allergy to seafood: Secondary | ICD-10-CM

## 2022-06-30 LAB — CBC
HCT: 43 % (ref 36.0–46.0)
Hemoglobin: 14.8 g/dL (ref 12.0–15.0)
MCH: 28.7 pg (ref 26.0–34.0)
MCHC: 34.4 g/dL (ref 30.0–36.0)
MCV: 83.3 fL (ref 80.0–100.0)
Platelets: 287 10*3/uL (ref 150–400)
RBC: 5.16 MIL/uL — ABNORMAL HIGH (ref 3.87–5.11)
RDW: 12.9 % (ref 11.5–15.5)
WBC: 18.7 10*3/uL — ABNORMAL HIGH (ref 4.0–10.5)
nRBC: 0 % (ref 0.0–0.2)

## 2022-06-30 LAB — MAGNESIUM: Magnesium: 1.9 mg/dL (ref 1.7–2.4)

## 2022-06-30 LAB — COMPREHENSIVE METABOLIC PANEL
ALT: 27 U/L (ref 0–44)
AST: 26 U/L (ref 15–41)
Albumin: 4.4 g/dL (ref 3.5–5.0)
Alkaline Phosphatase: 81 U/L (ref 38–126)
Anion gap: 12 (ref 5–15)
BUN: 16 mg/dL (ref 6–20)
CO2: 28 mmol/L (ref 22–32)
Calcium: 9.1 mg/dL (ref 8.9–10.3)
Chloride: 94 mmol/L — ABNORMAL LOW (ref 98–111)
Creatinine, Ser: 0.69 mg/dL (ref 0.44–1.00)
GFR, Estimated: >60 mL/min (ref >60)
Glucose, Bld: 148 mg/dL — ABNORMAL HIGH (ref 70–99)
Potassium: 2.1 mmol/L — CL (ref 3.5–5.1)
Sodium: 134 mmol/L — ABNORMAL LOW (ref 135–145)
Total Bilirubin: 1 mg/dL (ref 0.3–1.2)
Total Protein: 8.2 g/dL — ABNORMAL HIGH (ref 6.5–8.1)

## 2022-06-30 LAB — LACTIC ACID, PLASMA: Lactic Acid, Venous: 1.1 mmol/L (ref 0.5–1.9)

## 2022-06-30 LAB — LIPASE, BLOOD: Lipase: 27 U/L (ref 11–51)

## 2022-06-30 LAB — TROPONIN I (HIGH SENSITIVITY): Troponin I (High Sensitivity): 10 ng/L (ref <18)

## 2022-06-30 MED ORDER — MORPHINE SULFATE (PF) 4 MG/ML IV SOLN
4.0000 mg | INTRAVENOUS | Status: DC | PRN
  Administered 2022-06-30: 4 mg via INTRAVENOUS
  Filled 2022-06-30: qty 1

## 2022-06-30 MED ORDER — SODIUM CHLORIDE 0.9 % IV SOLN
2.0000 g | Freq: Once | INTRAVENOUS | Status: AC
  Administered 2022-07-01: 2 g via INTRAVENOUS
  Filled 2022-06-30: qty 12.5

## 2022-06-30 MED ORDER — SODIUM CHLORIDE 0.9 % IV SOLN: Freq: Once | INTRAVENOUS | Status: DC

## 2022-06-30 MED ORDER — ONDANSETRON HCL 4 MG/2ML IJ SOLN
4.0000 mg | Freq: Once | INTRAMUSCULAR | Status: AC
  Administered 2022-06-30: 4 mg via INTRAVENOUS
  Filled 2022-06-30: qty 2

## 2022-06-30 MED ORDER — IOHEXOL 300 MG/ML  SOLN
100.0000 mL | Freq: Once | INTRAMUSCULAR | Status: AC | PRN
  Administered 2022-06-30: 100 mL via INTRAVENOUS

## 2022-06-30 MED ORDER — METRONIDAZOLE 500 MG/100ML IV SOLN
500.0000 mg | Freq: Once | INTRAVENOUS | Status: AC
  Administered 2022-07-01: 500 mg via INTRAVENOUS
  Filled 2022-06-30: qty 100

## 2022-06-30 MED ORDER — POTASSIUM CHLORIDE 10 MEQ/100ML IV SOLN
10.0000 meq | Freq: Once | INTRAVENOUS | Status: AC
  Administered 2022-06-30: 10 meq via INTRAVENOUS
  Filled 2022-06-30: qty 100

## 2022-06-30 MED ORDER — SODIUM CHLORIDE 0.9 % IV BOLUS
1000.0000 mL | Freq: Once | INTRAVENOUS | Status: AC
  Administered 2022-06-30: 1000 mL via INTRAVENOUS

## 2022-06-30 NOTE — ED Triage Notes (Signed)
Pt to ED from home c/o lower abd pain that started yesterday, denies n/v/d, states urinary pressure but decreased output.  States abd distended, bloated, and last normal BM 4 days ago.  Recently returned from [place].  Pt A&Ox4, chest rise even and unlabored, skin WNL and in NAD at this time.

## 2022-06-30 NOTE — ED Provider Notes (Signed)
[facility] Provider Note    Event Date/Time   First MD Initiated Contact with Patient 06/30/22 2210     (approximate)   History   Abdominal Pain   HPI  Angela Cain is a 51 y.o. female no significant past medical history recently traveling from out of country [place] returning over the weekend presents to the ER for worsening abdominal pain distention.  Has not had much to eat or drink has had some chills denies any chest pain.  No diarrhea.  No recent antibiotics.  Has not been making much urine.     Physical Exam   Triage Vital Signs: ED Triage Vitals  Enc Vitals Group     BP 06/30/22 2124 137/88     Pulse Rate 06/30/22 2124 (!) 129     Resp 06/30/22 2124 20     Temp 06/30/22 2124 99.4 F (37.4 C)     Temp Source 06/30/22 2124 Oral     SpO2 06/30/22 2124 94 %     Weight 06/30/22 2128 140 lb (63.5 kg)     Height 06/30/22 2128 '5\' 3"'$  (1.6 m)     Head Circumference --      Peak Flow --      Pain Score 06/30/22 2128 10     Pain Loc --      Pain Edu? --      Excl. in [place]? --     Most recent vital signs: Vitals:   06/30/22 2124  BP: 137/88  Pulse: (!) 129  Resp: 20  Temp: 99.4 F (37.4 C)  SpO2: 94%     Constitutional: Alert  Eyes: Conjunctivae are normal.  Head: Atraumatic. Nose: No congestion/rhinnorhea. Mouth/Throat: Mucous membranes are moist.   Neck: Painless ROM.  Cardiovascular:   Good peripheral circulation. Respiratory: Normal respiratory effort.  No retractions.  Gastrointestinal: Soft and nontender.  Musculoskeletal:  no deformity Neurologic:  MAE spontaneously. No gross focal neurologic deficits are appreciated.  Skin:  Skin is warm, dry and intact. No rash noted. Psychiatric: Mood and affect are normal. Speech and behavior are normal.    ED Results / Procedures / Treatments   Labs (all labs ordered are listed, but only abnormal results are displayed) Labs Reviewed  COMPREHENSIVE METABOLIC PANEL -  Abnormal; Notable for the following components:      Result Value   Sodium 134 (*)    Potassium 2.1 (*)    Chloride 94 (*)    Glucose, Bld 148 (*)    Total Protein 8.2 (*)    All other components within normal limits  CBC - Abnormal; Notable for the following components:   WBC 18.7 (*)    RBC 5.16 (*)    All other components within normal limits  LIPASE, BLOOD  MAGNESIUM  LACTIC ACID, PLASMA  URINALYSIS, ROUTINE W REFLEX MICROSCOPIC  LACTIC ACID, PLASMA  POC URINE PREG, ED  TROPONIN I (HIGH SENSITIVITY)     EKG  ED ECG REPORT I, Merlyn Lot, the attending physician, personally viewed and interpreted this ECG.   Date: 06/30/2022  EKG Time: 22:51  Rate: 110  Rhythm: sinus  Axis: normal  Intervals:prolonged qt   ST&T Change: non specific st abn    RADIOLOGY Please see ED Course for my review and interpretation.  I personally reviewed all radiographic images ordered to evaluate for the above acute complaints and reviewed radiology reports and findings.  These findings were personally discussed with the patient.  Please  see medical record for radiology report.    PROCEDURES:  Critical Care performed: Yes, see critical care procedure note(s)  .Critical Care  Performed by: Merlyn Lot, MD Authorized by: Merlyn Lot, MD   Critical care provider statement:    Critical care time (minutes):  35   Critical care was necessary to treat or prevent imminent or life-threatening deterioration of the following conditions:  Metabolic crisis   Critical care was time spent personally by me on the following activities:  Ordering and performing treatments and interventions, ordering and review of laboratory studies, ordering and review of radiographic studies, pulse oximetry, re-evaluation of patient's condition, review of old charts, obtaining history from patient or surrogate, examination of patient, evaluation of patient's response to treatment, discussions with  primary provider, discussions with consultants and development of treatment plan with patient or surrogate    MEDICATIONS ORDERED IN ED: Medications  potassium chloride 10 mEq in 100 mL IVPB (10 mEq Intravenous New Bag/Given 06/30/22 2237)  morphine (PF) 4 MG/ML injection 4 mg (4 mg Intravenous Given 06/30/22 2238)  iohexol (OMNIPAQUE) 300 MG/ML solution 100 mL (has no administration in time range)  ceFEPIme (MAXIPIME) 2 g in sodium chloride 0.9 % 100 mL IVPB (has no administration in time range)  metroNIDAZOLE (FLAGYL) IVPB 500 mg (has no administration in time range)  0.9 %  sodium chloride infusion (has no administration in time range)  sodium chloride 0.9 % bolus 1,000 mL (1,000 mLs Intravenous New Bag/Given 06/30/22 2238)  ondansetron (ZOFRAN) injection 4 mg (4 mg Intravenous Given 06/30/22 2238)     IMPRESSION / MDM / [place] / ED COURSE  I reviewed the triage vital signs and the nursing notes.                              Differential diagnosis includes, but is not limited to, sepsis, appendicitis, diverticulitis, abscess, SBO, colitis, mesenteric ischemia, dehydration, kidney stone, cystitis  Patient presenting to the ER for evaluation of symptoms as described above.  Base on symptoms, risk factors and considered above differential, this presenting complaint could reflect a potentially life-threatening illness therefore the patient will be placed on continuous pulse oximetry and telemetry for monitoring.  Laboratory evaluation will be sent to evaluate for the above complaints.     Clinical Course as of 06/30/22 2336  Wed Jun 30, 2022  2335 Given patient's acute hyperkalemia she is can require hospitalization.  Her lactate came back normal.  Magnesium normal.  Patient receiving IV fluids have ordered broad-spectrum antibiotics.  CT pending.  Patient be signed out to oncoming physician pending follow-up CT imaging and admission to appropriate service. [PR]    Clinical  Course User Index [PR] Merlyn Lot, MD     FINAL CLINICAL IMPRESSION(S) / ED DIAGNOSES   Final diagnoses:  Right lower quadrant abdominal pain  Hypokalemia     Rx / DC Orders   ED Discharge Orders     None        Note:  This document was prepared using Dragon voice recognition software and may include unintentional dictation errors.     Merlyn Lot, MD 06/30/22 [phone]

## 2022-06-30 NOTE — Progress Notes (Signed)
PHARMACY -  BRIEF ANTIBIOTIC NOTE   Pharmacy has received consult(s) for Cefepime from an ED provider.  The patient's profile has been reviewed for ht/wt/allergies/indication/available labs.    One time order(s) placed for Cefepime 2 gm IV X 1.   Further antibiotics/pharmacy consults should be ordered by admitting physician if indicated.                       Thank you, Rehman Levinson D 06/30/2022  11:57 PM

## 2022-07-01 ENCOUNTER — Encounter: Payer: Self-pay | Admitting: Internal Medicine

## 2022-07-01 DIAGNOSIS — K5792 Diverticulitis of intestine, part unspecified, without perforation or abscess without bleeding: Secondary | ICD-10-CM

## 2022-07-01 DIAGNOSIS — K5720 Diverticulitis of large intestine with perforation and abscess without bleeding: Secondary | ICD-10-CM

## 2022-07-01 DIAGNOSIS — E876 Hypokalemia: Secondary | ICD-10-CM

## 2022-07-01 LAB — URINALYSIS, ROUTINE W REFLEX MICROSCOPIC
Bilirubin Urine: NEGATIVE
Glucose, UA: NEGATIVE mg/dL
Ketones, ur: NEGATIVE mg/dL
Leukocytes,Ua: NEGATIVE
Nitrite: NEGATIVE
Protein, ur: NEGATIVE mg/dL
Specific Gravity, Urine: 1.026 (ref 1.005–1.030)
pH: 6 (ref 5.0–8.0)

## 2022-07-01 LAB — BASIC METABOLIC PANEL
Anion gap: 9 (ref 5–15)
BUN: 11 mg/dL (ref 6–20)
CO2: 30 mmol/L (ref 22–32)
Calcium: 8.6 mg/dL — ABNORMAL LOW (ref 8.9–10.3)
Chloride: 98 mmol/L (ref 98–111)
Creatinine, Ser: 0.69 mg/dL (ref 0.44–1.00)
GFR, Estimated: >60 mL/min (ref >60)
Glucose, Bld: 133 mg/dL — ABNORMAL HIGH (ref 70–99)
Potassium: 2.6 mmol/L — CL (ref 3.5–5.1)
Sodium: 137 mmol/L (ref 135–145)

## 2022-07-01 LAB — TROPONIN I (HIGH SENSITIVITY): Troponin I (High Sensitivity): 12 ng/L (ref <18)

## 2022-07-01 LAB — HIV ANTIBODY (ROUTINE TESTING W REFLEX): HIV Screen 4th Generation wRfx: NONREACTIVE

## 2022-07-01 LAB — POTASSIUM: Potassium: 3.1 mmol/L — ABNORMAL LOW (ref 3.5–5.1)

## 2022-07-01 MED ORDER — POTASSIUM CHLORIDE CRYS ER 20 MEQ PO TBCR
40.0000 meq | EXTENDED_RELEASE_TABLET | Freq: Once | ORAL | Status: AC
  Administered 2022-07-01: 40 meq via ORAL
  Filled 2022-07-01: qty 2

## 2022-07-01 MED ORDER — POTASSIUM CHLORIDE 20 MEQ PO PACK
40.0000 meq | PACK | Freq: Once | ORAL | Status: AC
  Administered 2022-07-01: 40 meq via ORAL
  Filled 2022-07-01: qty 2

## 2022-07-01 MED ORDER — ONDANSETRON HCL 4 MG/2ML IJ SOLN: 4.0000 mg | Freq: Four times a day (QID) | INTRAMUSCULAR | Status: DC | PRN

## 2022-07-01 MED ORDER — AMOXICILLIN-POT CLAVULANATE 875-125 MG PO TABS
1.0000 | ORAL_TABLET | Freq: Two times a day (BID) | ORAL | Status: DC
  Administered 2022-07-01: 1 via ORAL
  Filled 2022-07-01: qty 1

## 2022-07-01 MED ORDER — ACETAMINOPHEN 325 MG PO TABS: 650.0000 mg | ORAL_TABLET | Freq: Four times a day (QID) | ORAL | Status: DC | PRN

## 2022-07-01 MED ORDER — AMOXICILLIN-POT CLAVULANATE 875-125 MG PO TABS: 1.0000 | ORAL_TABLET | Freq: Two times a day (BID) | ORAL | 0 refills | Status: AC

## 2022-07-01 MED ORDER — ONDANSETRON HCL 4 MG PO TABS: 4.0000 mg | ORAL_TABLET | Freq: Four times a day (QID) | ORAL | Status: DC | PRN

## 2022-07-01 MED ORDER — MORPHINE SULFATE (PF) 2 MG/ML IV SOLN
2.0000 mg | INTRAVENOUS | Status: DC | PRN
  Administered 2022-07-01 (×2): 2 mg via INTRAVENOUS
  Filled 2022-07-01 (×2): qty 1

## 2022-07-01 MED ORDER — ENOXAPARIN SODIUM 40 MG/0.4ML IJ SOSY
40.0000 mg | PREFILLED_SYRINGE | INTRAMUSCULAR | Status: DC
  Administered 2022-07-01: 40 mg via SUBCUTANEOUS
  Filled 2022-07-01: qty 0.4

## 2022-07-01 MED ORDER — HYDROCODONE-ACETAMINOPHEN 5-325 MG PO TABS: 1.0000 | ORAL_TABLET | ORAL | Status: DC | PRN

## 2022-07-01 MED ORDER — LACTATED RINGERS IV SOLN: INTRAVENOUS | Status: AC

## 2022-07-01 MED ORDER — ACETAMINOPHEN 650 MG RE SUPP: 650.0000 mg | Freq: Four times a day (QID) | RECTAL | Status: DC | PRN

## 2022-07-01 MED ORDER — POTASSIUM CHLORIDE 10 MEQ/100ML IV SOLN
10.0000 meq | INTRAVENOUS | Status: AC
  Administered 2022-07-01 (×4): 10 meq via INTRAVENOUS
  Filled 2022-07-01 (×4): qty 100

## 2022-07-01 NOTE — Progress Notes (Signed)
Discharge instructions reviewed with patient including followup visits and new medications.  Understanding was verbalized and all questions were answered.  IV removed without complication; patient tolerated well.  Patient discharged home via wheelchair in stable condition escorted by nursing staff.  

## 2022-07-01 NOTE — H&P (Signed)
History and Physical    Patient: Angela Cain SAY:[id number] DOB: 1970-10-10 DOA: 06/30/2022 DOS: the patient was seen and examined on 07/01/2022 PCP: Patient, No Pcp Per  Patient coming from: Home  Chief Complaint:  Chief Complaint  Patient presents with   Abdominal Pain    HPI: Angela Cain is a 51 y.o. female with medical history significant for No significant past medical history who presents to the ED with a 1 day history of lower abdominal pain, abdominal bloating with decreased appetite.  She denies fever or chills.  Has no diarrhea.  Last bowel movement was 4 days prior.  She has no nausea or vomiting.  Denies dysuria.  Denies chest pain or shortness of breath. ED course and data review: BP 137/88, temperature 99.4 with pulse 129 and O2 sat 94% on room air.  Labs with WBC 18,700 with lactic acid 1.1.  Lipase and LFTs WNL.  Potassium 2.1.  EKG, personally viewed and interpreted showing sinus tachycardia at 103 with no acute ST-T wave changes.  CT abdomen and pelvis shows mild sigmoid diverticulitis and uterine fibroids. Patient was treated with an IV fluid bolus, started on cefepime and metronidazole for diverticulitis .  She was started on a potassium rider as well as oral potassium repletion.  Hospitalist consulted for admission.   Review of Systems: As mentioned in the history of present illness. All other systems reviewed and are negative.  Past Medical History:  Diagnosis Date   Hypertension    History reviewed. No pertinent surgical history. Social History:  reports that she has quit smoking. Her smoking use included cigarettes. She has never used smokeless tobacco. She reports current alcohol use. She reports current drug use. Drug: Marijuana.  Allergies  Allergen Reactions   Shellfish Allergy Anaphylaxis    History reviewed. No pertinent family history.  Prior to Admission medications   Medication Sig Start Date End Date Taking? Authorizing  Provider  amLODipine (NORVASC) 10 MG tablet Take 1 tablet (10 mg total) by mouth daily. for high blood pressure 04/28/22  Yes Triplett, Cari B, FNP  albuterol (VENTOLIN HFA) 108 (90 Base) MCG/ACT inhaler Inhale 2 puffs into the lungs every 6 (six) hours as needed for wheezing or shortness of breath. Patient not taking: Reported on 06/30/2022 06/24/18   [provider]  metoprolol tartrate (LOPRESSOR) 25 MG tablet Take 1 tablet (25 mg total) by mouth 2 (two) times daily. Patient not taking: Reported on 06/30/2022 04/28/22 04/28/23  Sherrie George B, FNP  potassium chloride (K-DUR) 10 MEQ tablet Take 4 tablets (40 mEq total) by mouth 2 (two) times daily. Patient not taking: Reported on 06/30/2022 10/26/18   Eula Listen, MD    Physical Exam: Vitals:   06/30/22 2124 06/30/22 2128  BP: 137/88   Pulse: (!) 129   Resp: 20   Temp: 99.4 F (37.4 C)   TempSrc: Oral   SpO2: 94%   Weight:  63.5 kg  Height:  '5\' 3"'$  (1.6 m)   Physical Exam Vitals and nursing note reviewed.  Constitutional:      General: She is not in acute distress. HENT:     Head: Normocephalic and atraumatic.  Cardiovascular:     Rate and Rhythm: Normal rate and regular rhythm.     Heart sounds: Normal heart sounds.  Pulmonary:     Effort: Pulmonary effort is normal.     Breath sounds: Normal breath sounds.  Abdominal:     Palpations: Abdomen is soft.  Tenderness: There is abdominal tenderness in the suprapubic area.  Neurological:     Mental Status: Mental status is at baseline.     Labs on Admission: I have personally reviewed following labs and imaging studies  CBC: Recent Labs  Lab 06/30/22 2132  WBC 18.7*  HGB 14.8  HCT 43.0  MCV 83.3  PLT 299   Basic Metabolic Panel: Recent Labs  Lab 06/30/22 2132 06/30/22 2222  NA 134*  --   K 2.1*  --   CL 94*  --   CO2 28  --   GLUCOSE 148*  --   BUN 16  --   CREATININE 0.69  --   CALCIUM 9.1  --   MG  --  1.9   GFR: Estimated  Creatinine Clearance: 74.6 mL/min (by C-G formula based on SCr of 0.69 mg/dL). Liver Function Tests: Recent Labs  Lab 06/30/22 2132  AST 26  ALT 27  ALKPHOS 81  BILITOT 1.0  PROT 8.2*  ALBUMIN 4.4   Recent Labs  Lab 06/30/22 2132  LIPASE 27   No results for input(s): "AMMONIA" in the last 168 hours. Coagulation Profile: No results for input(s): "INR", "PROTIME" in the last 168 hours. Cardiac Enzymes: No results for input(s): "CKTOTAL", "CKMB", "CKMBINDEX", "TROPONINI" in the last 168 hours. BNP (last 3 results) No results for input(s): "PROBNP" in the last 8760 hours. HbA1C: No results for input(s): "HGBA1C" in the last 72 hours. CBG: No results for input(s): "GLUCAP" in the last 168 hours. Lipid Profile: No results for input(s): "CHOL", "HDL", "LDLCALC", "TRIG", "CHOLHDL", "LDLDIRECT" in the last 72 hours. Thyroid Function Tests: No results for input(s): "TSH", "T4TOTAL", "FREET4", "T3FREE", "THYROIDAB" in the last 72 hours. Anemia Panel: No results for input(s): "VITAMINB12", "FOLATE", "FERRITIN", "TIBC", "IRON", "RETICCTPCT" in the last 72 hours. Urine analysis:    Component Value Date/Time   COLORURINE STRAW (A) 04/28/2022 1342   APPEARANCEUR CLEAR (A) 04/28/2022 1342   APPEARANCEUR Clear 06/25/2013 0326   LABSPEC 1.011 04/28/2022 1342   LABSPEC 1.005 06/25/2013 0326   PHURINE 7.0 04/28/2022 1342   GLUCOSEU NEGATIVE 04/28/2022 1342   GLUCOSEU Negative 06/25/2013 0326   HGBUR MODERATE (A) 04/28/2022 1342   BILIRUBINUR NEGATIVE 04/28/2022 1342   BILIRUBINUR Negative 06/25/2013 0326   KETONESUR NEGATIVE 04/28/2022 1342   PROTEINUR NEGATIVE 04/28/2022 1342   NITRITE NEGATIVE 04/28/2022 1342   LEUKOCYTESUR NEGATIVE 04/28/2022 1342   LEUKOCYTESUR Negative 06/25/2013 0326    Radiological Exams on Admission: CT ABDOMEN PELVIS W CONTRAST  Result Date: 07/01/2022 CLINICAL DATA:  Lower abdominal pain EXAM: CT ABDOMEN AND PELVIS WITH CONTRAST TECHNIQUE: Multidetector  CT imaging of the abdomen and pelvis was performed using the standard protocol following bolus administration of intravenous contrast. RADIATION DOSE REDUCTION: This exam was performed according to the departmental dose-optimization program which includes automated exposure control, adjustment of the mA and/or kV according to patient size and/or use of iterative reconstruction technique. CONTRAST:  168m OMNIPAQUE IOHEXOL 300 MG/ML  SOLN COMPARISON:  Pelvic ultrasound 05/15/2018 FINDINGS: Lower chest: No acute abnormality. Hepatobiliary: Hepatic steatosis. No focal liver abnormality is seen. No gallstones, gallbladder wall thickening, or biliary dilatation. Pancreas: Unremarkable. No pancreatic ductal dilatation or surrounding inflammatory changes. Spleen: Normal in size without focal abnormality. Adrenals/Urinary Tract: Unremarkable adrenal glands. No urinary calculi or hydronephrosis. Unremarkable bladder. Stomach/Bowel: Unremarkable stomach. Sigmoid descending colon diverticulosis. There is mild wall thickening and adjacent fat stranding about a sigmoid diverticulum (series 5/image 47) compatible with diverticulitis. No fluid collection or free  air. Normal caliber large and small bowel. The appendix is not visualized. Vascular/Lymphatic: Aortic atherosclerosis. No enlarged abdominal or pelvic lymph nodes. Reproductive: Fibroid within the right uterine fundus as evaluated with ultrasound 05/15/2018. No adnexal mass. Other: No free intraperitoneal air or fluid. Musculoskeletal: No acute osseous abnormality. IMPRESSION: Mild sigmoid diverticulitis. Uterine fibroids. Electronically Signed   By: Placido Sou M.D.   On: 07/01/2022 00:00     Data Reviewed: Relevant notes from primary care and specialist visits, past discharge summaries as available in EHR, including Care Everywhere. Prior diagnostic testing as pertinent to current admission diagnoses Updated medications and problem lists for reconciliation ED  course, including vitals, labs, imaging, treatment and response to treatment Triage notes, nursing and pharmacy notes and ED provider's notes Notable results as noted in HPI   Assessment and Plan: * Acute diverticulitis Abdominal pain with low-grade fever and leukocytosis and tachycardia with sigmoid diverticulitis on CT Continue cefepime and Flagyl Clear liquids, antiemetics, pain control, IV fluids and supportive care  Hypokalemia Oral repletion and monitor        DVT prophylaxis: Lovenox  Consults: none  Advance Care Planning: full  Family Communication: none  Disposition Plan: Back to previous home environment  Severity of Illness: The appropriate patient status for this patient is INPATIENT. Inpatient status is judged to be reasonable and necessary in order to provide the required intensity of service to ensure the patient's safety. The patient's presenting symptoms, physical exam findings, and initial radiographic and laboratory data in the context of their chronic comorbidities is felt to place them at high risk for further clinical deterioration. Furthermore, it is not anticipated that the patient will be medically stable for discharge from the hospital within 2 midnights of admission.   * I certify that at the point of admission it is my clinical judgment that the patient will require inpatient hospital care spanning beyond 2 midnights from the point of admission due to high intensity of service, high risk for further deterioration and high frequency of surveillance required.*  Author: Athena Masse, MD 07/01/2022 12:48 AM  For on call review [email or web address].

## 2022-07-01 NOTE — ED Provider Notes (Signed)
See of this patient in signout with abdominal pain, leukocytosis, low-grade fever, hypokalemia with prolonged QT who got antibiotics and potassium repletion, pending CT scan. Patient remains stable, ongoing lower abdominal pain. CT scan reveals mild diverticulitis Plan for admission.     Lucillie Garfinkel, MD 07/01/22 [phone]

## 2022-07-01 NOTE — Plan of Care (Signed)

## 2022-07-01 NOTE — Assessment & Plan Note (Signed)
Oral repletion and monitor 

## 2022-07-01 NOTE — Assessment & Plan Note (Signed)
Abdominal pain with low-grade fever and leukocytosis and tachycardia with sigmoid diverticulitis on CT Continue cefepime and Flagyl Clear liquids, antiemetics, pain control, IV fluids and supportive care

## 2022-07-01 NOTE — Discharge Summary (Signed)
Physician Discharge Summary   Gray  female DOB: 1971/08/21  HMC:[id number]  PCP: Patient, No Pcp Per  Admit date: 06/30/2022 Discharge date: 07/01/2022  Admitted From: home Disposition:  home CODE STATUS: Full code  Discharge Instructions     Discharge instructions   Complete by: As directed    You have a mild case of sigmoid diverticulitis.  Please finish 6 days of oral antibiotic Augmentin at home.  Your potassium level is low, and appears to be low also in the past.  You have received supplementation.  Please go to [facility] for lab check on potassium level next Monday.   Dr. Enzo Bi [facility] Course:  For full details, please see H&P, progress notes, consult notes and ancillary notes.  Briefly,  Angela Cain is a 51 y.o. female with no significant past medical history who presented to the ED with a 1 day history of lower abdominal pain, abdominal bloating with decreased appetite.    * Acute diverticulitis Abdominal pain with leukocytosis and tachycardia with mild sigmoid diverticulitis on CT. --received cefepime and Flagyl x1 on presentation.  Pt was discharged on 6 more days of Augmentin.   Acute on chronic Hypokalemia --monitored and repleted --Pt advised to check potassium level in her outpatient clinic within a week after discharge.  HTN --cont home amlodipine   Discharge Diagnoses:  Principal Problem:   Acute diverticulitis Active Problems:   Hypokalemia   30 Day Unplanned Readmission Risk Score    Flowsheet Row ED to Hosp-Admission (Current) from 06/30/2022 in [place]  30 Day Unplanned Readmission Risk Score (%) 11.08 Filed at 07/01/2022 0801       This score is the patient's risk of an unplanned readmission within 30 days of being discharged (0 -100%). The score is based on dignosis, age, lab data, medications, orders, and past utilization.   Low:  0-14.9    Medium: 15-21.9   High: 22-29.9   Extreme: 30 and above         Discharge Instructions:  Allergies as of 07/01/2022       Reactions   Shellfish Allergy Anaphylaxis        Medication List     TAKE these medications    amLODipine 10 MG tablet Commonly known as: NORVASC Take 1 tablet (10 mg total) by mouth daily. for high blood pressure   amoxicillin-clavulanate 875-125 MG tablet Commonly known as: AUGMENTIN Take 1 tablet by mouth every 12 (twelve) hours for 6 days.         Follow-up [place], [place] Follow up on 07/05/2022.   Specialty: General Practice Why: check potassium level. Contact information: [place] [address] [phone]                 Allergies  Allergen Reactions   Shellfish Allergy Anaphylaxis     The results of significant diagnostics from this hospitalization (including imaging, microbiology, ancillary and laboratory) are listed below for reference.   Consultations:   Procedures/Studies: CT ABDOMEN PELVIS W CONTRAST  Result Date: 07/01/2022 CLINICAL DATA:  Lower abdominal pain EXAM: CT ABDOMEN AND PELVIS WITH CONTRAST TECHNIQUE: Multidetector CT imaging of the abdomen and pelvis was performed using the standard protocol following bolus administration of intravenous contrast. RADIATION DOSE REDUCTION: This exam was performed according to the departmental dose-optimization program which includes automated exposure control,  adjustment of the mA and/or kV according to patient size and/or use of iterative reconstruction technique. CONTRAST:  142m OMNIPAQUE IOHEXOL 300 MG/ML  SOLN COMPARISON:  Pelvic ultrasound 05/15/2018 FINDINGS: Lower chest: No acute abnormality. Hepatobiliary: Hepatic steatosis. No focal liver abnormality is seen. No gallstones, gallbladder wall thickening, or biliary dilatation. Pancreas: Unremarkable. No pancreatic ductal dilatation or surrounding  inflammatory changes. Spleen: Normal in size without focal abnormality. Adrenals/Urinary Tract: Unremarkable adrenal glands. No urinary calculi or hydronephrosis. Unremarkable bladder. Stomach/Bowel: Unremarkable stomach. Sigmoid descending colon diverticulosis. There is mild wall thickening and adjacent fat stranding about a sigmoid diverticulum (series 5/image 47) compatible with diverticulitis. No fluid collection or free air. Normal caliber large and small bowel. The appendix is not visualized. Vascular/Lymphatic: Aortic atherosclerosis. No enlarged abdominal or pelvic lymph nodes. Reproductive: Fibroid within the right uterine fundus as evaluated with ultrasound 05/15/2018. No adnexal mass. Other: No free intraperitoneal air or fluid. Musculoskeletal: No acute osseous abnormality. IMPRESSION: Mild sigmoid diverticulitis. Uterine fibroids. Electronically Signed   By: TPlacido SouM.D.   On: 07/01/2022 00:00      Labs: BNP (last 3 results) No results for input(s): "BNP" in the last 8760 hours. Basic Metabolic Panel: Recent Labs  Lab 06/30/22 2132 06/30/22 2222 07/01/22 0322  NA 134*  --  137  K 2.1*  --  2.6*  CL 94*  --  98  CO2 28  --  30  GLUCOSE 148*  --  133*  BUN 16  --  11  CREATININE 0.69  --  0.69  CALCIUM 9.1  --  8.6*  MG  --  1.9  --    Liver Function Tests: Recent Labs  Lab 06/30/22 2132  AST 26  ALT 27  ALKPHOS 81  BILITOT 1.0  PROT 8.2*  ALBUMIN 4.4   Recent Labs  Lab 06/30/22 2132  LIPASE 27   No results for input(s): "AMMONIA" in the last 168 hours. CBC: Recent Labs  Lab 06/30/22 2132  WBC 18.7*  HGB 14.8  HCT 43.0  MCV 83.3  PLT 287   Cardiac Enzymes: No results for input(s): "CKTOTAL", "CKMB", "CKMBINDEX", "TROPONINI" in the last 168 hours. BNP: Invalid input(s): "POCBNP" CBG: No results for input(s): "GLUCAP" in the last 168 hours. D-Dimer No results for input(s): "DDIMER" in the last 72 hours. Hgb A1c No results for input(s):  "HGBA1C" in the last 72 hours. Lipid Profile No results for input(s): "CHOL", "HDL", "LDLCALC", "TRIG", "CHOLHDL", "LDLDIRECT" in the last 72 hours. Thyroid function studies No results for input(s): "TSH", "T4TOTAL", "T3FREE", "THYROIDAB" in the last 72 hours.  Invalid input(s): "FREET3" Anemia work up No results for input(s): "VITAMINB12", "FOLATE", "FERRITIN", "TIBC", "IRON", "RETICCTPCT" in the last 72 hours. Urinalysis    Component Value Date/Time   COLORURINE YELLOW (A) 06/30/2022 2133   APPEARANCEUR HAZY (A) 06/30/2022 2133   APPEARANCEUR Clear 06/25/2013 0326   LABSPEC 1.026 06/30/2022 2133   LABSPEC 1.005 06/25/2013 0326   PHURINE 6.0 06/30/2022 2133   GLUCOSEU NEGATIVE 06/30/2022 2133   GLUCOSEU Negative 06/25/2013 0326   HGBUR MODERATE (A) 06/30/2022 2133   BILIRUBINUR NEGATIVE 06/30/2022 2133   BILIRUBINUR Negative 06/25/2013 0326   KETONESUR NEGATIVE 06/30/2022 2133   PROTEINUR NEGATIVE 06/30/2022 2133   NITRITE NEGATIVE 06/30/2022 2133   LEUKOCYTESUR NEGATIVE 06/30/2022 2133   LEUKOCYTESUR Negative 06/25/2013 0326   Sepsis Labs Recent Labs  Lab 06/30/22 2132  WBC 18.7*   Microbiology No results found for this or any previous visit (from the past  240 hour(s)).   Total time spend on discharging this patient, including the last patient exam, discussing the hospital stay, instructions for ongoing care as it relates to all pertinent caregivers, as well as preparing the medical discharge records, prescriptions, and/or referrals as applicable, is 35 minutes.    Enzo Bi, MD  [facility] 07/01/2022, 11:33 AM

## 2022-07-01 NOTE — TOC Transition Note (Signed)
Transition of Care [facility]) - CM/SW Discharge Note   Patient Details  Name: Angela Cain MRN: [id number] Date of Birth: [birth year]  Transition of Care [facility]) CM/SW Contact:  Candie Chroman, LCSW Phone Number: 07/01/2022, 11:55 AM   Clinical Narrative:   Patient has orders to discharge home today. CSW met with patient. No supports at bedside. CSW introduced role and inquired about not having a PCP. Patient said she gets her primary care at [facility] but does not have one specific provider she sees. CSW encouraged her to let them know at next appointment that she would like to only see one provider. Patient does not have insurance. Discharging on one new prescription that was sent to [facility]. She confirmed she has a ride home today. No further concerns. CSW signing off.  Final next level of care: Home/Self Care Barriers to Discharge: No Barriers Identified   Patient Goals and CMS Choice        Discharge Placement                    Patient and family notified of of transfer: 07/01/22  Discharge Plan and Services                                     Social Determinants of Health (SDOH) Interventions     Readmission Risk Interventions     No data to display

## 2022-07-15 ENCOUNTER — Emergency Department: Payer: Self-pay

## 2022-07-15 ENCOUNTER — Inpatient Hospital Stay
Admission: EM | Admit: 2022-07-15 | Discharge: 2022-07-16 | DRG: 392 | Disposition: A | Discharge status: Home / Self Care | Payer: Self-pay | Attending: Surgery | Admitting: Surgery

## 2022-07-15 ENCOUNTER — Other Ambulatory Visit: Payer: Self-pay

## 2022-07-15 DIAGNOSIS — Z91013 Allergy to seafood: Secondary | ICD-10-CM

## 2022-07-15 DIAGNOSIS — I10 Essential (primary) hypertension: Secondary | ICD-10-CM | POA: Diagnosis present

## 2022-07-15 DIAGNOSIS — E876 Hypokalemia: Secondary | ICD-10-CM | POA: Diagnosis present

## 2022-07-15 DIAGNOSIS — Z79899 Other long term (current) drug therapy: Secondary | ICD-10-CM

## 2022-07-15 DIAGNOSIS — Z87891 Personal history of nicotine dependence: Secondary | ICD-10-CM

## 2022-07-15 DIAGNOSIS — A419 Sepsis, unspecified organism: Principal | ICD-10-CM

## 2022-07-15 DIAGNOSIS — F1290 Cannabis use, unspecified, uncomplicated: Secondary | ICD-10-CM | POA: Diagnosis present

## 2022-07-15 DIAGNOSIS — K5780 Diverticulitis of intestine, part unspecified, with perforation and abscess without bleeding: Secondary | ICD-10-CM | POA: Diagnosis present

## 2022-07-15 DIAGNOSIS — I700 Atherosclerosis of aorta: Secondary | ICD-10-CM | POA: Diagnosis present

## 2022-07-15 DIAGNOSIS — K5720 Diverticulitis of large intestine with perforation and abscess without bleeding: Principal | ICD-10-CM | POA: Diagnosis present

## 2022-07-15 LAB — COMPREHENSIVE METABOLIC PANEL
ALT: 41 U/L (ref 0–44)
AST: 24 U/L (ref 15–41)
Albumin: 4 g/dL (ref 3.5–5.0)
Alkaline Phosphatase: 98 U/L (ref 38–126)
Anion gap: 10 (ref 5–15)
BUN: 10 mg/dL (ref 6–20)
CO2: 25 mmol/L (ref 22–32)
Calcium: 9.2 mg/dL (ref 8.9–10.3)
Chloride: 104 mmol/L (ref 98–111)
Creatinine, Ser: 0.74 mg/dL (ref 0.44–1.00)
GFR, Estimated: >60 mL/min (ref >60)
Glucose, Bld: 126 mg/dL — ABNORMAL HIGH (ref 70–99)
Potassium: 3 mmol/L — ABNORMAL LOW (ref 3.5–5.1)
Sodium: 139 mmol/L (ref 135–145)
Total Bilirubin: 0.7 mg/dL (ref 0.3–1.2)
Total Protein: 8.4 g/dL — ABNORMAL HIGH (ref 6.5–8.1)

## 2022-07-15 LAB — URINALYSIS, ROUTINE W REFLEX MICROSCOPIC
Bacteria, UA: NONE SEEN
Bilirubin Urine: NEGATIVE
Glucose, UA: NEGATIVE mg/dL
Ketones, ur: NEGATIVE mg/dL
Nitrite: NEGATIVE
Protein, ur: NEGATIVE mg/dL
Specific Gravity, Urine: 1.016 (ref 1.005–1.030)
pH: 6 (ref 5.0–8.0)

## 2022-07-15 LAB — CBC
HCT: 44.4 % (ref 36.0–46.0)
Hemoglobin: 14.6 g/dL (ref 12.0–15.0)
MCH: 28.4 pg (ref 26.0–34.0)
MCHC: 32.9 g/dL (ref 30.0–36.0)
MCV: 86.4 fL (ref 80.0–100.0)
Platelets: 504 10*3/uL — ABNORMAL HIGH (ref 150–400)
RBC: 5.14 MIL/uL — ABNORMAL HIGH (ref 3.87–5.11)
RDW: 13 % (ref 11.5–15.5)
WBC: 13.3 10*3/uL — ABNORMAL HIGH (ref 4.0–10.5)
nRBC: 0 % (ref 0.0–0.2)

## 2022-07-15 LAB — LACTIC ACID, PLASMA
Lactic Acid, Venous: 1 mmol/L (ref 0.5–1.9)
Lactic Acid, Venous: 1 mmol/L (ref 0.5–1.9)

## 2022-07-15 LAB — POC URINE PREG, ED: Preg Test, Ur: NEGATIVE

## 2022-07-15 LAB — LIPASE, BLOOD: Lipase: 25 U/L (ref 11–51)

## 2022-07-15 MED ORDER — ONDANSETRON 4 MG PO TBDP: 4.0000 mg | ORAL_TABLET | Freq: Four times a day (QID) | ORAL | Status: DC | PRN

## 2022-07-15 MED ORDER — ONDANSETRON HCL 4 MG/2ML IJ SOLN: 4.0000 mg | Freq: Four times a day (QID) | INTRAMUSCULAR | Status: DC | PRN

## 2022-07-15 MED ORDER — POTASSIUM CHLORIDE CRYS ER 20 MEQ PO TBCR
40.0000 meq | EXTENDED_RELEASE_TABLET | Freq: Once | ORAL | Status: AC
  Administered 2022-07-15: 40 meq via ORAL
  Filled 2022-07-15: qty 2

## 2022-07-15 MED ORDER — KETOROLAC TROMETHAMINE 30 MG/ML IJ SOLN
30.0000 mg | Freq: Four times a day (QID) | INTRAMUSCULAR | Status: DC
  Administered 2022-07-15: 30 mg via INTRAVENOUS
  Filled 2022-07-15: qty 1

## 2022-07-15 MED ORDER — ENOXAPARIN SODIUM 40 MG/0.4ML IJ SOSY
40.0000 mg | PREFILLED_SYRINGE | Freq: Every day | INTRAMUSCULAR | Status: DC
  Administered 2022-07-15: 40 mg via SUBCUTANEOUS
  Filled 2022-07-15 (×2): qty 0.4

## 2022-07-15 MED ORDER — HYDROMORPHONE HCL 1 MG/ML IJ SOLN: 0.5000 mg | INTRAMUSCULAR | Status: DC | PRN

## 2022-07-15 MED ORDER — SODIUM CHLORIDE 0.9 % IV BOLUS
1000.0000 mL | Freq: Once | INTRAVENOUS | Status: AC
  Administered 2022-07-15: 1000 mL via INTRAVENOUS

## 2022-07-15 MED ORDER — PIPERACILLIN-TAZOBACTAM 3.375 G IVPB 30 MIN
3.3750 g | Freq: Once | INTRAVENOUS | Status: AC
  Administered 2022-07-15: 3.375 g via INTRAVENOUS
  Filled 2022-07-15: qty 50

## 2022-07-15 MED ORDER — SODIUM CHLORIDE 0.9 % IV SOLN: INTRAVENOUS | Status: DC

## 2022-07-15 MED ORDER — IOHEXOL 300 MG/ML  SOLN
100.0000 mL | Freq: Once | INTRAMUSCULAR | Status: AC | PRN
  Administered 2022-07-15: 100 mL via INTRAVENOUS

## 2022-07-15 MED ORDER — KETOROLAC TROMETHAMINE 30 MG/ML IJ SOLN: 30.0000 mg | Freq: Four times a day (QID) | INTRAMUSCULAR | Status: DC | PRN

## 2022-07-15 MED ORDER — ACETAMINOPHEN 500 MG PO TABS
1000.0000 mg | ORAL_TABLET | Freq: Four times a day (QID) | ORAL | Status: DC
  Administered 2022-07-15: 1000 mg via ORAL
  Filled 2022-07-15: qty 2

## 2022-07-15 MED ORDER — AMLODIPINE BESYLATE 10 MG PO TABS
10.0000 mg | ORAL_TABLET | Freq: Every day | ORAL | Status: DC
  Administered 2022-07-15 – 2022-07-16 (×2): 10 mg via ORAL
  Filled 2022-07-15: qty 1
  Filled 2022-07-15: qty 2

## 2022-07-15 MED ORDER — OXYCODONE HCL 5 MG PO TABS
5.0000 mg | ORAL_TABLET | Freq: Once | ORAL | Status: AC
  Administered 2022-07-15: 5 mg via ORAL
  Filled 2022-07-15: qty 1

## 2022-07-15 MED ORDER — PIPERACILLIN-TAZOBACTAM 3.375 G IVPB
3.3750 g | Freq: Three times a day (TID) | INTRAVENOUS | Status: DC
  Administered 2022-07-15 – 2022-07-16 (×2): 3.375 g via INTRAVENOUS
  Filled 2022-07-15 (×2): qty 50

## 2022-07-15 MED ORDER — POTASSIUM CHLORIDE CRYS ER 20 MEQ PO TBCR
20.0000 meq | EXTENDED_RELEASE_TABLET | Freq: Two times a day (BID) | ORAL | Status: DC
  Administered 2022-07-15 – 2022-07-16 (×2): 20 meq via ORAL
  Filled 2022-07-15 (×2): qty 1

## 2022-07-15 MED ORDER — OXYCODONE HCL 5 MG PO TABS: 5.0000 mg | ORAL_TABLET | ORAL | Status: DC | PRN

## 2022-07-15 MED ORDER — KETOROLAC TROMETHAMINE 15 MG/ML IJ SOLN
15.0000 mg | Freq: Once | INTRAMUSCULAR | Status: DC
  Filled 2022-07-15: qty 1

## 2022-07-15 MED ORDER — ACETAMINOPHEN 500 MG PO TABS
1000.0000 mg | ORAL_TABLET | Freq: Once | ORAL | Status: AC
  Administered 2022-07-15: 1000 mg via ORAL
  Filled 2022-07-15: qty 2

## 2022-07-15 NOTE — ED Provider Triage Note (Signed)
Emergency Medicine Provider Triage Evaluation Note  Angela Cain , a 51 y.o. female  was evaluated in triage.  Pt complains of abdominal pain.  Admitted to the hospital for critical value of potassium 2 weeks ago and was noted to have a colon infection.  Patient was given antibiotics.  Was told to return if not improving as if the infection got worse she would need surgery..  Review of Systems  Positive: Abdominal pain, bloating, constipation Negative: Fever  Physical Exam  BP (!) 130/97   Pulse (!) 107   Temp 97.7 F (36.5 C)   Resp 16   Ht '5\' 3"'$  (1.6 m)   Wt 68 kg   LMP 06/23/2022 (Approximate)   SpO2 96%   BMI 26.57 kg/m  Gen:   Awake, no distress   Resp:  Normal effort  MSK:   Moves extremities without difficulty  Other:  Abdomen tender in lower quadrants bilaterally, bowel sounds present  Medical Decision Making  Medically screening exam initiated at 7:45 AM.  Appropriate orders placed.  Angela Cain was informed that the remainder of the evaluation will be completed by another provider, this initial triage assessment does not replace that evaluation, and the importance of remaining in the ED until their evaluation is complete.  Labs and imaging ordered   Versie Starks, PA-C 07/15/22 [phone]

## 2022-07-15 NOTE — ED Triage Notes (Signed)
Pt here with post op complications. Pt states she was admitted 2 weeks ago for a infx in her colon. Pt states she is having trouble having a bowel movements. Pt states she has taken laxatives for a week with no success. Pt still having abd pain that is getting worse.

## 2022-07-15 NOTE — H&P (Addendum)
[facility] SURGICAL HISTORY & PHYSICAL (cpt [phone])  HISTORY OF PRESENT ILLNESS (HPI):  51 y.o. female presented to [facility] ED today for abdominal pain. Patient has a recent admission on 76/22 for uncomplicated diverticulitis. She ultimately was discharged the same day with Augmentin. She reports she did reasonably well until a few days ago when she and return of lower abdominal pain. This was a crampy pain similar to her presentation on 09/28. She also believed she was constipated and had been using laxatives without improvement. Nauseous as well. No fever, chills, CP, SOB, emesis, urinary changes. Only previous intra-abdominal surgery is C-section. Work up in the ED revealed leukocytosis to 13.3K, renal function normal with sCr - 0.74, hypokalemia to 3.0. CT Abdomen/Pelvis was concerning for sigmoid diverticulitis with small abscess.   General surgery is consulted by emergency medicine physician Dr Creed Copper, MD for evaluation and management of complicated diverticulitis  PAST MEDICAL HISTORY (PMH):  Past Medical History:  Diagnosis Date   Hypertension     Reviewed. Otherwise negative.   PAST SURGICAL HISTORY ([place]):  No past surgical history on file.  Reviewed. Otherwise negative.   MEDICATIONS:  Prior to Admission medications   Medication Sig Start Date End Date Taking? Authorizing Provider  amLODipine (NORVASC) 10 MG tablet Take 1 tablet (10 mg total) by mouth daily. for high blood pressure 04/28/22   Triplett, Cari B, FNP     ALLERGIES:  Allergies  Allergen Reactions   Shellfish Allergy Anaphylaxis     SOCIAL HISTORY:  Social History   Socioeconomic History   Marital status: Married    Spouse name: Not on file   Number of children: Not on file   Years of education: Not on file   Highest education level: Not on file  Occupational History   Not on file  Tobacco Use   Smoking status: Former    Types: Cigarettes   Smokeless tobacco: Never  Substance and  Sexual Activity   Alcohol use: Yes    Comment: rarely   Drug use: Yes    Types: Marijuana   Sexual activity: Not on file  Other Topics Concern   Not on file  Social History Narrative   Not on file   Social Determinants of Health   Financial Resource Strain: Not on file  Food Insecurity: Not on file  Transportation Needs: Not on file  Physical Activity: Not on file  Stress: Not on file  Social Connections: Not on file  Intimate Partner Violence: Not on file     FAMILY HISTORY:  No family history on file.  Otherwise negative.   REVIEW OF SYSTEMS:  Review of Systems  Constitutional:  Negative for chills and fever.  HENT:  Negative for congestion and sore throat.   Respiratory:  Negative for cough and shortness of breath.   Cardiovascular:  Negative for chest pain and palpitations.  Gastrointestinal:  Positive for abdominal pain, constipation and nausea. Negative for diarrhea and vomiting.  Genitourinary:  Negative for dysuria and urgency.  All other systems reviewed and are negative.   VITAL SIGNS:  Temp:  [97.7 F (36.5 C)] 97.7 F (36.5 C) (10/12 0731) Pulse Rate:  [107] 107 (10/12 0731) Resp:  [16] 16 (10/12 0731) BP: (130)/(97) 130/97 (10/12 0731) SpO2:  [96 %] 96 % (10/12 0731) Weight:  [68 kg] 68 kg (10/12 0733)     Height: '5\' 3"'$  (160 cm) Weight: 68 kg BMI (Calculated): 26.58   PHYSICAL EXAM:  Physical Exam Vitals and  nursing note reviewed. Exam conducted with a chaperone present.  Constitutional:      General: She is not in acute distress.    Appearance: She is well-developed and normal weight. She is not ill-appearing.     Comments: Patient resting in bed; NAD  HENT:     Head: Normocephalic and atraumatic.  Eyes:     General: No scleral icterus.    Extraocular Movements: Extraocular movements intact.  Cardiovascular:     Rate and Rhythm: Normal rate.     Heart sounds: Normal heart sounds. No murmur heard. Pulmonary:     Effort: Pulmonary effort is  normal. No respiratory distress.     Breath sounds: Normal breath sounds.  Abdominal:     General: A surgical scar is present. There is no distension.     Palpations: Abdomen is soft.     Tenderness: There is abdominal tenderness in the suprapubic area and left lower quadrant. There is no guarding or rebound.     Comments: Abdomen is soft, she has mild lower abdominal tenderness, non-distended, no rebound/guarding. She is without peritonitis   Genitourinary:    Comments: Deferred Skin:    General: Skin is warm and dry.     Coloration: Skin is not jaundiced or pale.  Neurological:     General: No focal deficit present.     Mental Status: She is alert and oriented to person, place, and time.  Psychiatric:        Mood and Affect: Mood normal. Affect is tearful.     INTAKE/OUTPUT:  This shift: No intake/output data recorded.  Last 2 shifts: '@IOLAST2SHIFTS'$ @  Labs:     Latest Ref Rng & Units 07/15/2022    7:41 AM 06/30/2022    9:32 PM 04/28/2022    1:42 PM  CBC  WBC 4.0 - 10.5 K/uL 13.3  18.7  7.5   Hemoglobin 12.0 - 15.0 g/dL 14.6  14.8  14.4   Hematocrit 36.0 - 46.0 % 44.4  43.0  42.8   Platelets 150 - 400 K/uL 504  287  271       Latest Ref Rng & Units 07/15/2022    7:41 AM 07/01/2022   11:56 AM 07/01/2022    3:22 AM  CMP  Glucose 70 - 99 mg/dL 126   133   BUN 6 - 20 mg/dL 10   11   Creatinine 0.44 - 1.00 mg/dL 0.74   0.69   Sodium 135 - 145 mmol/L 139   137   Potassium 3.5 - 5.1 mmol/L 3.0  3.1  2.6   Chloride 98 - 111 mmol/L 104   98   CO2 22 - 32 mmol/L 25   30   Calcium 8.9 - 10.3 mg/dL 9.2   8.6   Total Protein 6.5 - 8.1 g/dL 8.4     Total Bilirubin 0.3 - 1.2 mg/dL 0.7     Alkaline Phos 38 - 126 U/L 98     AST 15 - 41 U/L 24     ALT 0 - 44 U/L 41       Imaging studies:   CT Abdomen/Pelvis (07/15/2022) personally reviewed which shows sigmoid diverticulitis with small abscess, question whether or not this is intra-luminal, no free air, and this appears new/worse  compared against 09/28 CT, and radiologist report reviewed below:  IMPRESSION: 1. Findings of acute diverticulitis with increased wall thickening and adjacent inflammatory change when compared with prior. New fluid collection arising from the wall of the  sigmoid colon, compatible with abscess. 2. Aortic Atherosclerosis (ICD10-I70.0).     Assessment/Plan: (ICD-10's: K28.80) 51 y.o. female with recurrent vs under-treated sigmoid diverticulitis with small abscess, possibly intra-luminal   - Admit to general surgery  - Okay for CLD + IVF support  - No need for emergent surgical intervention. She, and friend on the phone, understand that should she fail to improve or clinically deteriorate we will need to consider more urgent interventions (ie: colectomy, colostomy). If she recovers from this insult, there is role for considering elective sigmoid colectomy given this episode of complicated diverticulitis   - Abscess is too small for drainage, may also be intra-luminal. Pending clinical condition, she may benefit from repeat CT in 48-72 hours.  - Monitor abdominal examination; on-going bowel function - IV Abx (Zosyn) - Pain control prn; antiemetics prn  - Replete K+; monitor  - Monitor leukocytosis - Home antihypertensive medication (amlodipine)   - Mobilize as tolerated    All of the above findings and recommendations were discussed with the patient (and her friend on the phone), and all of her questions were answered to her expressed satisfaction.  -- Edison Simon, PA-C [facility] 07/15/2022, 11:18 AM M-F: 7am - 4pm

## 2022-07-15 NOTE — Progress Notes (Signed)
IV Team consulted for difficult IV placement.  Upon arrival, pt unavail.  Returned and pt was OTF to CT; RN stated she already obtained a PIV.  IVT cleared consult.

## 2022-07-15 NOTE — ED Provider Notes (Signed)
[facility] Provider Note    Event Date/Time   First MD Initiated Contact with Patient 07/15/22 1016     (approximate)   History   Post-op Problem and Abdominal Pain   HPI  Angela Cain is a 51 y.o. female  with recent admission for diverticulitis on course of augmentin previously after short hospital admission coming in with abdominal pain. Pt reports finishing antiboitcs last week. The pain did go away for a few days and then slowly to come back last Friday.  No fevers, no vomiting. No chest pain or SOB.  No dysuria. + constipation reports taking a laxative.   Has been taking  tylenol and ibuprofen. Did try an old perocet      Physical Exam   Triage Vital Signs: ED Triage Vitals  Enc Vitals Group     BP 07/15/22 0731 (!) 130/97     Pulse Rate 07/15/22 0731 (!) 107     Resp 07/15/22 0731 16     Temp 07/15/22 0731 97.7 F (36.5 C)     Temp src --      SpO2 07/15/22 0731 96 %     Weight 07/15/22 0733 150 lb (68 kg)     Height 07/15/22 0733 5' 3" (1.6 m)     Head Circumference --      Peak Flow --      Pain Score 07/15/22 0735 7     Pain Loc --      Pain Edu? --      Excl. in [place]? --     Most recent vital signs: Vitals:   07/15/22 0731  BP: (!) 130/97  Pulse: (!) 107  Resp: 16  Temp: 97.7 F (36.5 C)  SpO2: 96%     General: Awake, no distress.  CV:  Good peripheral perfusion.  Resp:  Normal effort.  Abd:  No distention. + lower abdominal pain  Other:     ED Results / Procedures / Treatments   Labs (all labs ordered are listed, but only abnormal results are displayed) Labs Reviewed  COMPREHENSIVE METABOLIC PANEL - Abnormal; Notable for the following components:      Result Value   Potassium 3.0 (*)    Glucose, Bld 126 (*)    Total Protein 8.4 (*)    All other components within normal limits  CBC - Abnormal; Notable for the following components:   WBC 13.3 (*)    RBC 5.14 (*)    Platelets 504 (*)    All  other components within normal limits  URINALYSIS, ROUTINE W REFLEX MICROSCOPIC - Abnormal; Notable for the following components:   Color, Urine YELLOW (*)    APPearance HAZY (*)    Hgb urine dipstick MODERATE (*)    Leukocytes,Ua TRACE (*)    All other components within normal limits  LIPASE, BLOOD  POC URINE PREG, ED     RADIOLOGY I have reviewed the ct personally and interpretted and + abscess   PROCEDURES:  Critical Care performed: yes   .Critical Care  Performed by: Vanessa [place], MD Authorized by: Vanessa [place], MD   Critical care provider statement:    Critical care time (minutes):  30   Critical care was necessary to treat or prevent imminent or life-threatening deterioration of the following conditions:  Sepsis   Critical care was time spent personally by me on the following activities:  Development of treatment plan with patient or surrogate, discussions with consultants, evaluation  of patient's response to treatment, examination of patient, ordering and review of laboratory studies, ordering and review of radiographic studies, ordering and performing treatments and interventions, pulse oximetry, re-evaluation of patient's condition and review of old charts    MEDICATIONS ORDERED IN ED: Medications  iohexol (OMNIPAQUE) 300 MG/ML solution 100 mL (100 mLs Intravenous Contrast Given 07/15/22 1028)     IMPRESSION / MDM / ASSESSMENT AND PLAN / ED COURSE  I reviewed the triage vital signs and the nursing notes.   Patient's presentation is most consistent with acute presentation with potential threat to life or bodily function.   Differential diverticulitis, perforation, abscess  Preg test negative Lipase normal CMP potassium 3.0 Cbc white count going down   CT + abscess  11:08 AM discussed with IR Dr. Henn nothing that can be drained by IR   Discussed with surg zach they will admit to surg. Met sepsis crtieria will get BC and lactate     FINAL CLINICAL  IMPRESSION(S) / ED DIAGNOSES   Final diagnoses:  Sepsis, due to unspecified organism, unspecified whether acute organ dysfunction present (HCC)  Colonic diverticular abscess     Rx / DC Orders   ED Discharge Orders     None        Note:  This document was prepared using Dragon voice recognition software and may include unintentional dictation errors.   Funke, Mary E, MD 07/15/22 1115  

## 2022-07-15 NOTE — Consult Note (Signed)
CODE SEPSIS - PHARMACY COMMUNICATION  **Broad Spectrum Antibiotics should be administered within 1 hour of Sepsis diagnosis**  Time Code Sepsis Called/Page Received: 1108  Antibiotics Ordered: Zosyn 3.375 g  Time of 1st antibiotic administration: 1122  Additional action taken by pharmacy: N/A  If necessary, Name of Provider/Nurse Contacted: N/A  Dara Hoyer, PharmD PGY-1 Pharmacy Resident 07/15/2022 11:21 AM

## 2022-07-16 LAB — BASIC METABOLIC PANEL
Anion gap: 9 (ref 5–15)
BUN: 8 mg/dL (ref 6–20)
CO2: 23 mmol/L (ref 22–32)
Calcium: 8.7 mg/dL — ABNORMAL LOW (ref 8.9–10.3)
Chloride: 106 mmol/L (ref 98–111)
Creatinine, Ser: 0.65 mg/dL (ref 0.44–1.00)
GFR, Estimated: >60 mL/min (ref >60)
Glucose, Bld: 106 mg/dL — ABNORMAL HIGH (ref 70–99)
Potassium: 3.3 mmol/L — ABNORMAL LOW (ref 3.5–5.1)
Sodium: 138 mmol/L (ref 135–145)

## 2022-07-16 LAB — PHOSPHORUS: Phosphorus: 3.3 mg/dL (ref 2.5–4.6)

## 2022-07-16 LAB — CBC
HCT: 39.9 % (ref 36.0–46.0)
Hemoglobin: 13.3 g/dL (ref 12.0–15.0)
MCH: 28.7 pg (ref 26.0–34.0)
MCHC: 33.3 g/dL (ref 30.0–36.0)
MCV: 86.2 fL (ref 80.0–100.0)
Platelets: 435 10*3/uL — ABNORMAL HIGH (ref 150–400)
RBC: 4.63 MIL/uL (ref 3.87–5.11)
RDW: 12.8 % (ref 11.5–15.5)
WBC: 11.8 10*3/uL — ABNORMAL HIGH (ref 4.0–10.5)
nRBC: 0 % (ref 0.0–0.2)

## 2022-07-16 LAB — MAGNESIUM: Magnesium: 2.2 mg/dL (ref 1.7–2.4)

## 2022-07-16 MED ORDER — CIPROFLOXACIN HCL 500 MG PO TABS: 500.0000 mg | ORAL_TABLET | Freq: Two times a day (BID) | ORAL | 0 refills | Status: AC

## 2022-07-16 MED ORDER — HYDROCODONE-ACETAMINOPHEN 5-325 MG PO TABS: 1.0000 | ORAL_TABLET | Freq: Four times a day (QID) | ORAL | 0 refills | Status: DC | PRN

## 2022-07-16 MED ORDER — METRONIDAZOLE 500 MG PO TABS: 500.0000 mg | ORAL_TABLET | Freq: Three times a day (TID) | ORAL | 0 refills | Status: AC

## 2022-07-16 NOTE — Discharge Instructions (Signed)
Diverticulitis  Diverticulitis is when small pouches in your colon (large intestine) get infected or swollen. This causes pain in the belly (abdomen) and watery poop (diarrhea). These pouches are called diverticula. The pouches form in people who have a condition called diverticulosis. What are the causes? This condition may be caused by poop (stool) that gets trapped in the pouches in your colon. The poop lets germs (bacteria) grow in the pouches. This causes the infection. What increases the risk? You are more likely to get this condition if you have small pouches in your colon. The risk is higher if: You are overweight or very overweight (obese). You do not exercise enough. You drink alcohol. You smoke or use products with tobacco in them. You eat a diet that has a lot of red meat such as beef, pork, or lamb. You eat a diet that does not have enough fiber in it. You are older than 52 years of age. What are the signs or symptoms? Pain in the belly. Pain is often on the left side, but it may be in other areas. Fever and feeling cold. Feeling like you may vomit. Vomiting. Having cramps. Feeling full. Changes to how often you poop. Blood in your poop. How is this treated? Most cases are treated at home by: Taking over-the-counter pain medicines. Following a clear liquid diet. Taking antibiotic medicines. Resting. Very bad cases may need to be treated at a hospital. This may include: Not eating or drinking. Taking prescription pain medicine. Getting antibiotic medicines through an IV tube. Getting fluid and food through an IV tube. Having surgery. When you are feeling better, your doctor may tell you to have a test to check your colon (colonoscopy). Follow these instructions at home: Medicines Take over-the-counter and prescription medicines only as told by your doctor. These include: Antibiotics. Pain medicines. Fiber pills. Probiotics. Stool softeners. If you were  prescribed an antibiotic medicine, take it as told by your doctor. Do not stop taking the antibiotic even if you start to feel better. Ask your doctor if the medicine prescribed to you requires you to avoid driving or using machinery. Eating and drinking  Follow a diet as told by your doctor. When you feel better, your doctor may tell you to change your diet. You may need to eat a lot of fiber. Fiber makes it easier to poop (have a bowel movement). Foods with fiber include: Berries. Beans. Lentils. Green vegetables. Avoid eating red meat. General instructions Do not use any products that contain nicotine or tobacco, such as cigarettes, e-cigarettes, and chewing tobacco. If you need help quitting, ask your doctor. Exercise 3 or more times a week. Try to get 30 minutes each time. Exercise enough to sweat and make your heart beat faster. Keep all follow-up visits as told by your doctor. This is important. Contact a doctor if: Your pain does not get better. You are not pooping like normal. Get help right away if: Your pain gets worse. Your symptoms do not get better. Your symptoms get worse very fast. You have a fever. You vomit more than one time. You have poop that is: Bloody. Black. Tarry. Summary This condition happens when small pouches in your colon get infected or swollen. Take medicines only as told by your doctor. Follow a diet as told by your doctor. Keep all follow-up visits as told by your doctor. This is important. This information is not intended to replace advice given to you by your health care provider. Make sure   you discuss any questions you have with your health care provider. Document Revised: 07/02/2019 Document Reviewed: 07/02/2019 Elsevier Patient Education  2023 Elsevier Inc.  

## 2022-07-16 NOTE — TOC Progression Note (Signed)
Transition of Care [facility]) - Progression Note    Patient Details  Name: Angela Cain MRN: [id number] Date of Birth: September 29, 1971  Transition of Care [facility]) CM/SW [place], RN Phone Number: 07/16/2022, 9:51 AM  Clinical Narrative:         Transition of Care (TOC) Screening Note   Patient Details  Name: Angela Cain Date of Birth: 01/10/1971   Transition of Care [facility]) CM/SW Contact:    Conception Oms, RN Phone Number: 07/16/2022, 9:51 AM    Transition of Care Department [facility]) has reviewed patient and no TOC needs have been identified at this time. We will continue to monitor patient advancement through interdisciplinary progression rounds. If new patient transition needs arise, please place a TOC consult.      Expected Discharge Plan and Services           Expected Discharge Date: 07/16/22                                     Social Determinants of Health (SDOH) Interventions    Readmission Risk Interventions     No data to display

## 2022-07-17 NOTE — Discharge Summary (Signed)
Patient ID: ELLYANNA Cain MRN: [id number] DOB/AGE: April 27, 1971 51 y.o.  Admit date: 07/15/2022 Discharge date: 07/16/2022   Discharge Diagnoses:  Principal Problem:   Diverticulitis of intestine with abscess without bleeding, unspecified part of intestinal tract    Hospital Course: 51 year old female comes to the emergency room for evaluation of abdominal pain especially in the left lower quadrant.  She was evaluated and appropriate work-up was performed to include a CT scan of the abdomen pelvis as well as a laboratory evaluation.  I have personally reviewed the CT scan showing evidence of diverticulitis with intramural abscess.  The patient was admitted to the surgical service and was resuscitated and started on appropriate antibiotics.  We kept the patient n.p.o. and follow her with serial abdominal exams. Patient was kept overnight.  At The time of discharge the patient was ambulating,  pain was controlled.  Her vital signs were stable and she was afebrile.   physical exam at discharge showed a pt  in no acute distress.  Awake and alert.  Abdomen: Soft  significant improvement of tenderness, w minimal soreness and no peritonitis.  Extremities well-perfused and no edema.  Condition of the patient the time of discharge was stable We had a lengthy conversation regarding her disease process.  This is the first time of diverticulitis and she has never had any colonoscopy.  She will definitely need appropriate antibiotics and outpatient followed by a colonoscopy in about 2 months.  She does have certain limitations regarding insurance and she wishes to enroll in an insurance plan.  We also talk briefly about the role of the sigmoid colectomy in the near future again this is early stages of her disease.  I will be happy to see her in follow-up in a few weeks to continue further discussions.  Please note that I spent greater than 45 minutes in this encounter and preparing her discharge packet.   This included coordination of her care, placing orders and performing appropriate documentation   Disposition: Discharge disposition: 01-Home or Self Care       Discharge Instructions     Call MD for:  difficulty breathing, headache or visual disturbances   Complete by: As directed    Call MD for:  extreme fatigue   Complete by: As directed    Call MD for:  hives   Complete by: As directed    Call MD for:  persistant dizziness or light-headedness   Complete by: As directed    Call MD for:  persistant nausea and vomiting   Complete by: As directed    Call MD for:  redness, tenderness, or signs of infection (pain, swelling, redness, odor or green/yellow discharge around incision site)   Complete by: As directed    Call MD for:  severe uncontrolled pain   Complete by: As directed    Call MD for:  temperature >100.4   Complete by: As directed    Diet - low sodium heart healthy   Complete by: As directed    Increase activity slowly   Complete by: As directed       Allergies as of 07/16/2022       Reactions   Shellfish Allergy Anaphylaxis        Medication List     STOP taking these medications    traMADol 50 MG tablet Commonly known as: ULTRAM       TAKE these medications    amLODipine 10 MG tablet Commonly known as: NORVASC Take 1 tablet (  10 mg total) by mouth daily. for high blood pressure   ciprofloxacin 500 MG tablet Commonly known as: Cipro Take 1 tablet (500 mg total) by mouth 2 (two) times daily for 10 days.   dicyclomine 20 MG tablet Commonly known as: BENTYL Take 20 mg by mouth 4 (four) times daily.   HYDROcodone-acetaminophen 5-325 MG tablet Commonly known as: NORCO/VICODIN Take 1 tablet by mouth every 6 (six) hours as needed for moderate pain.   metoCLOPramide 10 MG tablet Commonly known as: REGLAN Take 10 mg by mouth 4 (four) times daily.   metroNIDAZOLE 500 MG tablet Commonly known as: FLAGYL Take 1 tablet (500 mg total) by mouth  3 (three) times daily for 10 days.   potassium chloride 8 MEQ tablet Commonly known as: KLOR-CON Take 8 mEq by mouth daily.        Follow-up Information     G[place], [place], MD Follow up on 08/23/2022.   Specialty: General Surgery Why: Appointment @ 9:15am Contact information: [address] [place] [address] [phone]                  Caroleen Hamman, MD FACS

## 2022-07-20 LAB — CULTURE, BLOOD (ROUTINE X 2)
Culture: NO GROWTH
Culture: NO GROWTH
Special Requests: ADEQUATE
Special Requests: ADEQUATE

## 2022-08-23 ENCOUNTER — Inpatient Hospital Stay: Payer: Self-pay | Admitting: Surgery

## 2023-05-31 ENCOUNTER — Encounter: Payer: Self-pay | Admitting: Primary Care

## 2023-06-11 ENCOUNTER — Other Ambulatory Visit: Payer: Self-pay

## 2023-06-11 ENCOUNTER — Emergency Department: Payer: Medicaid Other

## 2023-06-11 ENCOUNTER — Emergency Department
Admission: EM | Admit: 2023-06-11 | Discharge: 2023-06-11 | Disposition: A | Discharge status: Home / Self Care | Payer: Medicaid Other | Attending: Emergency Medicine | Admitting: Emergency Medicine

## 2023-06-11 DIAGNOSIS — K5730 Diverticulosis of large intestine without perforation or abscess without bleeding: Secondary | ICD-10-CM | POA: Insufficient documentation

## 2023-06-11 DIAGNOSIS — R1084 Generalized abdominal pain: Secondary | ICD-10-CM | POA: Diagnosis present

## 2023-06-11 DIAGNOSIS — I10 Essential (primary) hypertension: Secondary | ICD-10-CM | POA: Insufficient documentation

## 2023-06-11 DIAGNOSIS — K5790 Diverticulosis of intestine, part unspecified, without perforation or abscess without bleeding: Secondary | ICD-10-CM

## 2023-06-11 LAB — CBC WITH DIFFERENTIAL/PLATELET
Abs Immature Granulocytes: 0.03 10*3/uL (ref 0.00–0.07)
Basophils Absolute: 0 10*3/uL (ref 0.0–0.1)
Basophils Relative: 0 %
Eosinophils Absolute: 0.2 10*3/uL (ref 0.0–0.5)
Eosinophils Relative: 2 %
HCT: 42.9 % (ref 36.0–46.0)
Hemoglobin: 14.6 g/dL (ref 12.0–15.0)
Immature Granulocytes: 0 %
Lymphocytes Relative: 23 %
Lymphs Abs: 2.4 10*3/uL (ref 0.7–4.0)
MCH: 29.1 pg (ref 26.0–34.0)
MCHC: 34 g/dL (ref 30.0–36.0)
MCV: 85.5 fL (ref 80.0–100.0)
Monocytes Absolute: 0.4 10*3/uL (ref 0.1–1.0)
Monocytes Relative: 4 %
Neutro Abs: 7.1 10*3/uL (ref 1.7–7.7)
Neutrophils Relative %: 71 %
Platelets: 323 10*3/uL (ref 150–400)
RBC: 5.02 MIL/uL (ref 3.87–5.11)
RDW: 13.6 % (ref 11.5–15.5)
WBC: 10 10*3/uL (ref 4.0–10.5)
nRBC: 0 % (ref 0.0–0.2)

## 2023-06-11 LAB — URINALYSIS, ROUTINE W REFLEX MICROSCOPIC
Bacteria, UA: NONE SEEN
Bilirubin Urine: NEGATIVE
Glucose, UA: NEGATIVE mg/dL
Ketones, ur: NEGATIVE mg/dL
Nitrite: NEGATIVE
Protein, ur: 30 mg/dL — AB
Specific Gravity, Urine: 1.033 — ABNORMAL HIGH (ref 1.005–1.030)
pH: 5 (ref 5.0–8.0)

## 2023-06-11 LAB — COMPREHENSIVE METABOLIC PANEL
ALT: 21 U/L (ref 0–44)
AST: 17 U/L (ref 15–41)
Albumin: 4.2 g/dL (ref 3.5–5.0)
Alkaline Phosphatase: 103 U/L (ref 38–126)
Anion gap: 9 (ref 5–15)
BUN: 17 mg/dL (ref 6–20)
CO2: 23 mmol/L (ref 22–32)
Calcium: 9 mg/dL (ref 8.9–10.3)
Chloride: 107 mmol/L (ref 98–111)
Creatinine, Ser: 0.86 mg/dL (ref 0.44–1.00)
GFR, Estimated: >60 mL/min (ref >60)
Glucose, Bld: 107 mg/dL — ABNORMAL HIGH (ref 70–99)
Potassium: 3.4 mmol/L — ABNORMAL LOW (ref 3.5–5.1)
Sodium: 139 mmol/L (ref 135–145)
Total Bilirubin: 0.5 mg/dL (ref 0.3–1.2)
Total Protein: 7.9 g/dL (ref 6.5–8.1)

## 2023-06-11 LAB — PREGNANCY, URINE: Preg Test, Ur: NEGATIVE

## 2023-06-11 LAB — LIPASE, BLOOD: Lipase: 27 U/L (ref 11–51)

## 2023-06-11 NOTE — Discharge Instructions (Addendum)
Your CT scan showed diverticulosis with out signs of infection.  The most important thing is that you follow-up with GI for colonoscopy.  The information is attached and you can call the office to schedule an appointment.  You can take Tylenol or ibuprofen as needed for pain.  I recommend that you eat a full liquid diet for the next few days to give your bowels a rest.  Information about what foods to eat is attached.  You can get Gas-X over-the-counter which will help with your bloating and abdominal distention.  I have also placed a referral for a primary care provider, keep an eye out for a call regarding this.

## 2023-06-11 NOTE — ED Provider Notes (Signed)
[facility] Provider Note    Event Date/Time   First MD Initiated Contact with Patient 06/11/23 1328     (approximate)   History   Abdominal Pain (Patient is here with generalized abdominal pain that began approx. 2-3 weeks ago; She was here in Oct. 2023 and was diagnosed with diverticulitis with an abscess and says that ever since then, she has been having trouble having bowel movements (last one was this morning); Patient recently returned from the [place])   HPI  Angela Cain is a 52 y.o. female with PMH of diverticulitis, hypertension who presents for evaluation of generalized abdominal pain.  Patient states she has had pain ongoing for 2 to 3 weeks.  She was previously admitted for diverticulitis with an abscess and states that since then she has had difficulty with bowel movements.  Patient describes herself as straining but has loose stools.  She has not noticed any blood in her stools.  She has had some nausea but denies vomiting.  No fevers.  She also endorses urinary frequency.       Physical Exam   Triage Vital Signs: ED Triage Vitals  Encounter Vitals Group     BP 06/11/23 1248 (!) 159/95     Systolic BP Percentile --      Diastolic BP Percentile --      Pulse Rate 06/11/23 1248 68     Resp 06/11/23 1248 16     Temp 06/11/23 1248 98 F (36.7 C)     Temp Source 06/11/23 1248 Oral     SpO2 06/11/23 1248 98 %     Weight 06/11/23 1243 160 lb (72.6 kg)     Height 06/11/23 1243 5\' 3"  (1.6 m)     Head Circumference --      Peak Flow --      Pain Score 06/11/23 1243 8     Pain Loc --      Pain Education --      Exclude from Growth Chart --     Most recent vital signs: Vitals:   06/11/23 1248  BP: (!) 159/95  Pulse: 68  Resp: 16  Temp: 98 F (36.7 C)  SpO2: 98%    General: Awake, no distress.  CV:  Good peripheral perfusion. RRR. Resp:  Normal effort. CTAB. Abd:  Distended belly, tender to palpation in left  lower quadrant and suprapubically, soft, decreased bowel sounds.    ED Results / Procedures / Treatments   Labs (all labs ordered are listed, but only abnormal results are displayed) Labs Reviewed  COMPREHENSIVE METABOLIC PANEL - Abnormal; Notable for the following components:      Result Value   Potassium 3.4 (*)    Glucose, Bld 107 (*)    All other components within normal limits  URINALYSIS, ROUTINE W REFLEX MICROSCOPIC - Abnormal; Notable for the following components:   Color, Urine YELLOW (*)    APPearance HAZY (*)    Specific Gravity, Urine 1.033 (*)    Hgb urine dipstick MODERATE (*)    Protein, ur 30 (*)    Leukocytes,Ua SMALL (*)    All other components within normal limits  LIPASE, BLOOD  CBC WITH DIFFERENTIAL/PLATELET  PREGNANCY, URINE     RADIOLOGY  Abdomen pelvis obtained, interpreted the images as well as reviewed the radiologist report.    PROCEDURES:  Critical Care performed: No  Procedures   MEDICATIONS ORDERED IN ED: Medications - No data to display  IMPRESSION / MDM / ASSESSMENT AND PLAN / ED COURSE  I reviewed the triage vital signs and the nursing notes.                             52 year old female presents for evaluation of generalized abdominal pain.  Patient was hypertensive in triage with history of hypertension, otherwise VSS.  Patient NAD and nontoxic-appearing on exam.  Differential diagnosis includes, but is not limited to, diverticulitis, constipation, Crohn's disease, ulcerative colitis, irritable bowel syndrome, ovarian cyst.  Patient's presentation is most consistent with acute complicated illness / injury requiring diagnostic workup.  CBC, CMP and lipase unremarkable.  Pregnancy test was negative.  Urinalysis shows moderate hemoglobin, protein, small leukocytes, not consistent with a UTI.  CT abdomen pelvis obtained, interpreted the images as well as reviewed the radiologist report.  Has sigmoid diverticulosis with minimal  focal stranding adjacent to the sigmoid diverticula that may be scarring.  Radiologist cannot exclude mild acute diverticulitis.  She does not have an abscess or perforation.  No bowel obstruction.  Since patient does not have an elevated white count I do not believe she has diverticulitis, and antibiotics are not indicated.  I advised patient on a liquid diet.  She can take Tylenol and ibuprofen as needed for pain.  Can also pick up Gas-X to help with her bloating.  I emphasized the importance of following up with GI for colonoscopy.   Patient voiced understanding, all questions were answered and she was stable at discharge.      FINAL CLINICAL IMPRESSION(S) / ED DIAGNOSES   Final diagnoses:  Diverticulosis     Rx / DC Orders   ED Discharge Orders          Ordered    Ambulatory Referral to Primary Care (Establish Care)        06/11/23 1412             Note:  This document was prepared using Dragon voice recognition software and may include unintentional dictation errors.   Cameron Ali, PA-C 06/11/23 1623    Trinna Post, MD 06/11/23 1904

## 2023-06-11 NOTE — ED Triage Notes (Addendum)
Patient is here with generalized abdominal pain that began approx. 2-3 weeks ago; She was here in Oct. 2023 and was diagnosed with diverticulitis with an abscess and says that ever since then, she has been having trouble having bowel movements (last one was this morning); Patient recently returned from the [place]

## 2023-06-13 ENCOUNTER — Telehealth: Payer: Self-pay | Admitting: Gastroenterology

## 2023-06-13 NOTE — Group Note (Deleted)

## 2023-06-13 NOTE — Telephone Encounter (Signed)
Patient called in to schedule.

## 2023-07-07 ENCOUNTER — Other Ambulatory Visit: Payer: Self-pay | Admitting: Primary Care

## 2023-07-07 DIAGNOSIS — Z1231 Encounter for screening mammogram for malignant neoplasm of breast: Secondary | ICD-10-CM

## 2023-07-07 LAB — HM PAP SMEAR

## 2023-08-02 ENCOUNTER — Other Ambulatory Visit: Payer: Self-pay

## 2023-08-08 ENCOUNTER — Ambulatory Visit: Payer: Self-pay | Admitting: Gastroenterology

## 2023-10-05 DIAGNOSIS — D126 Benign neoplasm of colon, unspecified: Secondary | ICD-10-CM

## 2023-10-05 HISTORY — DX: Benign neoplasm of colon, unspecified: D12.6

## 2023-10-12 ENCOUNTER — Ambulatory Visit
Admission: RE | Admit: 2023-10-12 | Discharge: 2023-10-12 | Disposition: A | Discharge status: Home / Self Care | Payer: Medicaid Other | Source: Ambulatory Visit | Attending: Primary Care | Admitting: Primary Care

## 2023-10-12 DIAGNOSIS — Z1231 Encounter for screening mammogram for malignant neoplasm of breast: Secondary | ICD-10-CM | POA: Diagnosis present

## 2023-10-18 ENCOUNTER — Other Ambulatory Visit: Payer: Self-pay | Admitting: Primary Care

## 2023-10-18 DIAGNOSIS — R928 Other abnormal and inconclusive findings on diagnostic imaging of breast: Secondary | ICD-10-CM

## 2023-10-24 ENCOUNTER — Ambulatory Visit
Admission: RE | Admit: 2023-10-24 | Discharge: 2023-10-24 | Disposition: A | Discharge status: Home / Self Care | Payer: Medicaid Other | Source: Ambulatory Visit | Attending: Primary Care | Admitting: Primary Care

## 2023-10-24 DIAGNOSIS — R928 Other abnormal and inconclusive findings on diagnostic imaging of breast: Secondary | ICD-10-CM | POA: Diagnosis present

## 2023-10-27 ENCOUNTER — Other Ambulatory Visit: Payer: Self-pay | Admitting: Primary Care

## 2023-10-27 DIAGNOSIS — N63 Unspecified lump in unspecified breast: Secondary | ICD-10-CM

## 2023-10-27 DIAGNOSIS — R928 Other abnormal and inconclusive findings on diagnostic imaging of breast: Secondary | ICD-10-CM

## 2023-11-02 ENCOUNTER — Ambulatory Visit
Admission: RE | Admit: 2023-11-02 | Discharge: 2023-11-02 | Disposition: A | Discharge status: Home / Self Care | Payer: Medicaid Other | Source: Ambulatory Visit | Attending: Primary Care

## 2023-11-02 ENCOUNTER — Ambulatory Visit
Admission: RE | Admit: 2023-11-02 | Discharge: 2023-11-02 | Disposition: A | Discharge status: Home / Self Care | Payer: Medicaid Other | Source: Ambulatory Visit | Attending: Primary Care | Admitting: Primary Care

## 2023-11-02 DIAGNOSIS — N63 Unspecified lump in unspecified breast: Secondary | ICD-10-CM | POA: Diagnosis present

## 2023-11-02 DIAGNOSIS — R928 Other abnormal and inconclusive findings on diagnostic imaging of breast: Secondary | ICD-10-CM | POA: Diagnosis present

## 2023-11-02 DIAGNOSIS — N6012 Diffuse cystic mastopathy of left breast: Secondary | ICD-10-CM | POA: Insufficient documentation

## 2023-11-02 HISTORY — PX: BREAST BIOPSY: SHX20

## 2023-11-02 MED ORDER — LIDOCAINE HCL 1 % IJ SOLN
2.0000 mL | Freq: Once | INTRAMUSCULAR | Status: AC
  Administered 2023-11-02: 2 mL
  Filled 2023-11-02: qty 2

## 2023-11-02 MED ORDER — LIDOCAINE 1 % OPTIME INJ - NO CHARGE
2.0000 mL | Freq: Once | INTRAMUSCULAR | Status: AC
  Administered 2023-11-02: 2 mL
  Filled 2023-11-02: qty 2

## 2023-11-02 MED ORDER — LIDOCAINE-EPINEPHRINE 2 %-1:100000 IJ SOLN
5.0000 mL | Freq: Once | INTRAMUSCULAR | Status: AC
  Administered 2023-11-02: 5 mL
  Filled 2023-11-02: qty 5.1

## 2023-11-02 MED ORDER — LIDOCAINE-EPINEPHRINE 2 %-1:100000 IJ SOLN
8.0000 mL | Freq: Once | INTRAMUSCULAR | Status: AC
  Administered 2023-11-02: 8 mL
  Filled 2023-11-02: qty 8.5

## 2023-11-03 LAB — SURGICAL PATHOLOGY

## 2023-11-09 ENCOUNTER — Encounter: Payer: Self-pay | Admitting: Internal Medicine

## 2023-11-09 ENCOUNTER — Other Ambulatory Visit: Payer: Self-pay

## 2023-11-09 ENCOUNTER — Ambulatory Visit: Payer: Medicaid Other | Admitting: Internal Medicine

## 2023-11-09 VITALS — BP 130/86 | HR 100 | Temp 98.3°F | Resp 16 | Ht 63.0 in | Wt 162.7 lb

## 2023-11-09 DIAGNOSIS — Z8619 Personal history of other infectious and parasitic diseases: Secondary | ICD-10-CM | POA: Diagnosis not present

## 2023-11-09 DIAGNOSIS — Z13220 Encounter for screening for lipoid disorders: Secondary | ICD-10-CM

## 2023-11-09 DIAGNOSIS — I10 Essential (primary) hypertension: Secondary | ICD-10-CM | POA: Insufficient documentation

## 2023-11-09 DIAGNOSIS — R3916 Straining to void: Secondary | ICD-10-CM | POA: Diagnosis not present

## 2023-11-09 DIAGNOSIS — K5904 Chronic idiopathic constipation: Secondary | ICD-10-CM | POA: Diagnosis not present

## 2023-11-09 DIAGNOSIS — Z1159 Encounter for screening for other viral diseases: Secondary | ICD-10-CM

## 2023-11-09 DIAGNOSIS — Z1211 Encounter for screening for malignant neoplasm of colon: Secondary | ICD-10-CM

## 2023-11-09 DIAGNOSIS — D219 Benign neoplasm of connective and other soft tissue, unspecified: Secondary | ICD-10-CM

## 2023-11-09 DIAGNOSIS — R140 Abdominal distension (gaseous): Secondary | ICD-10-CM

## 2023-11-09 LAB — POCT URINALYSIS DIPSTICK
Bilirubin, UA: NEGATIVE
Glucose, UA: NEGATIVE
Ketones, UA: NEGATIVE
Leukocytes, UA: NEGATIVE
Nitrite, UA: NEGATIVE
Protein, UA: POSITIVE — AB
Spec Grav, UA: 1.02 (ref 1.010–1.025)
Urobilinogen, UA: 0.2 U/dL
pH, UA: 5 (ref 5.0–8.0)

## 2023-11-09 NOTE — Assessment & Plan Note (Signed)
 Blood pressure stable here today, no changes made to medications. Labs ordered.

## 2023-11-09 NOTE — Patient Instructions (Addendum)
 It was great seeing you today!  Plan discussed at today's visit: -Blood work ordered today, results will be uploaded to MyChart.  -Stool tests ordered to rule out any further intestinal parasites -Referral to GI placed -Ultrasound ordered to assess fibroids  -Consider shingles vaccine at the pharmacy, more info below  Follow up in: 6 months or sooner as needed  Take care and let us  know if you have any questions or concerns prior to your next visit.  Dr. Bernardo  Recombinant Zoster (Shingles) Vaccine: What You Need to Know Many vaccine information statements are available in Spanish and other languages. See promoage.com.br. 1. Why get vaccinated? Recombinant zoster (shingles) vaccine can prevent shingles. Shingles (also called herpes zoster, or just zoster) is a painful skin rash, usually with blisters. In addition to the rash, shingles can cause fever, headache, chills, or upset stomach. Rarely, shingles can lead to complications such as pneumonia, hearing problems, blindness, brain inflammation (encephalitis), or death. The risk of shingles increases with age. The most common complication of shingles is long-term nerve pain called postherpetic neuralgia (PHN). PHN occurs in the areas where the shingles rash was and can last for months or years after the rash goes away. The pain from PHN can be severe and debilitating. The risk of PHN increases with age. An older adult with shingles is more likely to develop PHN and have longer lasting and more severe pain than a younger person. People with weakened immune systems also have a higher risk of getting shingles and complications from the disease. Shingles is caused by varicella-zoster virus, the same virus that causes chickenpox. After you have chickenpox, the virus stays in your body and can cause shingles later in life. Shingles cannot be passed from one person to another, but the virus that causes shingles can spread and cause chickenpox  in someone who has never had chickenpox or has never received chickenpox vaccine. 2. Recombinant shingles vaccine Recombinant shingles vaccine provides strong protection against shingles. By preventing shingles, recombinant shingles vaccine also protects against PHN and other complications. Recombinant shingles vaccine is recommended for: Adults 50 years and older Adults 19 years and older who have a weakened immune system because of disease or treatments Shingles vaccine is given as a two-dose series. For most people, the second dose should be given 2 to 6 months after the first dose. Some people who have or will have a weakened immune system can get the second dose 1 to 2 months after the first dose. Ask your health care provider for guidance. People who have had shingles in the past and people who have received varicella (chickenpox) vaccine are recommended to get recombinant shingles vaccine. The vaccine is also recommended for people who have already gotten another type of shingles vaccine, the live shingles vaccine. There is no live virus in recombinant shingles vaccine. Shingles vaccine may be given at the same time as other vaccines. 3. Talk with your health care provider Tell your vaccination provider if the person getting the vaccine: Has had an allergic reaction after a previous dose of recombinant shingles vaccine, or has any severe, life-threatening allergies Is currently experiencing an episode of shingles Is pregnant In some cases, your health care provider may decide to postpone shingles vaccination until a future visit. People with minor illnesses, such as a cold, may be vaccinated. People who are moderately or severely ill should usually wait until they recover before getting recombinant shingles vaccine. Your health care provider can give you more information.  4. Risks of a vaccine reaction A sore arm with mild or moderate pain is very common after recombinant shingles vaccine.  Redness and swelling can also happen at the site of the injection. Tiredness, muscle pain, headache, shivering, fever, stomach pain, and nausea are common after recombinant shingles vaccine. These side effects may temporarily prevent a vaccinated person from doing regular activities. Symptoms usually go away on their own in 2 to 3 days. You should still get the second dose of recombinant shingles vaccine even if you had one of these reactions after the first dose. Guillain-Barr syndrome (GBS), a serious nervous system disorder, has been reported very rarely after recombinant zoster vaccine. People sometimes faint after medical procedures, including vaccination. Tell your provider if you feel dizzy or have vision changes or ringing in the ears. As with any medicine, there is a very remote chance of a vaccine causing a severe allergic reaction, other serious injury, or death. 5. What if there is a serious problem? An allergic reaction could occur after the vaccinated person leaves the clinic. If you see signs of a severe allergic reaction (hives, swelling of the face and throat, difficulty breathing, a fast heartbeat, dizziness, or weakness), call 9-1-1 and get the person to the nearest hospital. For other signs that concern you, call your health care provider. Adverse reactions should be reported to the Vaccine Adverse Event Reporting System (VAERS). Your health care provider will usually file this report, or you can do it yourself. Visit the VAERS website at www.vaers.lagents.no or call (830)364-3114. VAERS is only for reporting reactions, and VAERS staff members do not give medical advice. 6. How can I learn more? Ask your health care provider. Call your local or state health department. Visit the website of the Food and Drug Administration (FDA) for vaccine package inserts and additional information at goldcloset.com.ee. Contact the Centers for Disease Control and Prevention  (CDC): Call 681 167 3880 (1-800-CDC-INFO) or Visit CDC's website at piccapture.uy. Source: CDC Vaccine Information Statement Recombinant Zoster Vaccine (11/07/2020) This same material is available at footballexhibition.com.br for no charge. This information is not intended to replace advice given to you by your health care provider. Make sure you discuss any questions you have with your health care provider. Document Revised: 01/05/2023 Document Reviewed: 10/06/2022 Elsevier Patient Education  2024 [employer].

## 2023-11-09 NOTE — Progress Notes (Signed)
 New Patient Office Visit  Subjective    Patient ID: Angela Cain, female    DOB: 1970-11-13  Age: 53 y.o. MRN: [id number]  CC:  Chief Complaint  Patient presents with   Establish Care    HPI SAHVANNAH RIESER presents to establish care.  Hypertension: -Medications: Amlodipine  10 mg -Patient is compliant with above medications and reports no side effects. -Checking BP at home (average): not checking -Denies any SOB, CP, vision changes, LE edema or symptoms of hypotension  Was hospitalized in the [place] and [place] for constipation, diarrhea and vomiting. Was told she has a parasitic infection. Diarrhea has resolved but still having chronic constipation, uses a natural drink to help with constipation. Normal for her to have a BM every 2-3 days but will still strain. Also has a lot of abdominal fullness, did have a vaginal US  8/19 which showed large uterine fibroids.  She has since gone through menopause and no longer has vaginal bleeding but still has a lot of abdominal bloating which will press on her bladder and cause urinary urgency/frequency and small volume voids.  She denies dysuria, suprapubic/pelvic pain or abdominal pain.  Health Maintenance: -Blood work due -Mammogram 1/25 Birads-4, had biopsy which showed fibroadenoma, negative for malignancy  -Colon cancer screening due -Pap UTD, obtaining records  -Declines all vaccines, did discuss getting shingles vaccine at the pharmacy.   Outpatient Encounter Medications as of 11/09/2023  Medication Sig   amLODipine  (NORVASC ) 10 MG tablet Take 1 tablet (10 mg total) by mouth daily. for high blood pressure   [DISCONTINUED] dicyclomine (BENTYL) 20 MG tablet Take 20 mg by mouth 4 (four) times daily. (Patient not taking: Reported on 11/09/2023)   [DISCONTINUED] EFFEXOR XR 75 MG 24 hr capsule Take 75 mg by mouth daily. (Patient not taking: Reported on 11/09/2023)   [DISCONTINUED] HYDROcodone -acetaminophen  (NORCO/VICODIN) 5-325  MG tablet Take 1 tablet by mouth every 6 (six) hours as needed for moderate pain. (Patient not taking: Reported on 11/09/2023)   [DISCONTINUED] metoCLOPramide (REGLAN) 10 MG tablet Take 10 mg by mouth 4 (four) times daily. (Patient not taking: Reported on 11/09/2023)   [DISCONTINUED] oxybutynin (DITROPAN) 5 MG tablet Take 5 mg by mouth 2 (two) times daily. (Patient not taking: Reported on 11/09/2023)   [DISCONTINUED] potassium chloride  (KLOR-CON ) 8 MEQ tablet Take 8 mEq by mouth daily. (Patient not taking: Reported on 11/09/2023)   No facility-administered encounter medications on file as of 11/09/2023.    Past Medical History:  Diagnosis Date   Hypertension     Past Surgical History:  Procedure Laterality Date   BREAST BIOPSY Left 11/02/2023   us  bx 3:00 ribbon path pending   BREAST BIOPSY Left 11/02/2023   us  bx 9:00 coil path pending   BREAST BIOPSY Left 11/02/2023   us  bx 10:00 venus path pending   BREAST BIOPSY Left 11/02/2023   US  LT BREAST BX W LOC DEV 1ST LESION IMG BX SPEC US  GUIDE 11/02/2023 [facility]   BREAST BIOPSY Left 11/02/2023   US  LT BREAST BX W LOC DEV EA ADD LESION IMG BX SPEC US  GUIDE 11/02/2023 [facility]   BREAST BIOPSY Left 11/02/2023   US  LT BREAST BX W LOC DEV EA ADD LESION IMG BX SPEC US  GUIDE 11/02/2023 [facility]    Family History  Problem Relation Age of Onset   Breast cancer Sister 23    Social History   Socioeconomic History   Marital status: Married    Spouse name: Not on file  Number of children: Not on file   Years of education: Not on file   Highest education level: Not on file  Occupational History   Not on file  Tobacco Use   Smoking status: Former    Types: Cigarettes    Passive exposure: Past   Smokeless tobacco: Never  Vaping Use   Vaping status: Never Used  Substance and Sexual Activity   Alcohol use: Not Currently    Comment: rarely   Drug use: Yes    Types: Marijuana   Sexual activity: Not Currently  Other Topics  Concern   Not on file  Social History Narrative   Not on file   Social Drivers of Health   Financial Resource Strain: Not on file  Food Insecurity: Not on file  Transportation Needs: Not on file  Physical Activity: Not on file  Stress: Not on file  Social Connections: Not on file  Intimate Partner Violence: Not on file    Review of Systems  Constitutional:  Negative for chills and fever.  Respiratory:  Negative for shortness of breath.   Cardiovascular:  Negative for chest pain.  Gastrointestinal:  Positive for constipation. Negative for abdominal pain, nausea and vomiting.  Genitourinary:  Positive for frequency and urgency. Negative for dysuria, flank pain and hematuria.        Objective    BP 130/86 (Cuff Size: Large)   Pulse 100   Temp 98.3 F (36.8 C) (Oral)   Resp 16   Ht 5' 3 (1.6 m)   Wt 162 lb 11.2 oz (73.8 kg)   SpO2 98%   BMI 28.82 kg/m   Physical Exam Constitutional:      Appearance: Normal appearance.  HENT:     Head: Normocephalic and atraumatic.     Mouth/Throat:     Mouth: Mucous membranes are moist.     Pharynx: Oropharynx is clear.  Eyes:     Extraocular Movements: Extraocular movements intact.     Conjunctiva/sclera: Conjunctivae normal.     Pupils: Pupils are equal, round, and reactive to light.  Neck:     Comments: No thyromegaly Cardiovascular:     Rate and Rhythm: Normal rate and regular rhythm.  Pulmonary:     Effort: Pulmonary effort is normal.     Breath sounds: Normal breath sounds.  Musculoskeletal:     Cervical back: No tenderness.     Right lower leg: No edema.     Left lower leg: No edema.  Lymphadenopathy:     Cervical: No cervical adenopathy.  Skin:    General: Skin is warm and dry.  Neurological:     General: No focal deficit present.     Mental Status: She is alert. Mental status is at baseline.  Psychiatric:        Mood and Affect: Mood normal.        Behavior: Behavior normal.         Assessment &  Plan:  Primary hypertension Assessment & Plan: Blood pressure stable here today, no changes made to medications. Labs ordered.    Orders: -     CBC with Differential/Platelet -     COMPLETE METABOLIC PANEL WITH GFR  Chronic idiopathic constipation Assessment & Plan: Could be contributing to small volume voids/pelvic pressure, history of intestinal parasitic infections, will re-test today.   History of parasitic infection -     Ambulatory referral to Gastroenterology -     Ova and parasite examination -     Stool  culture  Straining to void -     POCT urinalysis dipstick  Abdominal bloating -     US  PELVIC COMPLETE WITH TRANSVAGINAL; Future  Fibroids -     US  PELVIC COMPLETE WITH TRANSVAGINAL; Future  Colon cancer screening -     Ambulatory referral to Gastroenterology  Need for hepatitis C screening test -     Hepatitis C antibody  Lipid screening -     Lipid panel  UA negative for nitrates or leukocytes.  Stool culture and O&P ordered to rule out intestinal infections, referral to GI placed for colon cancer screening.  Patient concerned her fibroids may be causing abdominal pressure, is interested in repeating ultrasound, this will be ordered today.  Discussed how fibroids generally reduce in size after menopause but patient still having a lot of abdominal fullness.  Other screening labs ordered today as well.  Return in about 6 months (around 05/08/2024).   Sharyle Fischer, DO

## 2023-11-09 NOTE — Assessment & Plan Note (Signed)
 Could be contributing to small volume voids/pelvic pressure, history of intestinal parasitic infections, will re-test today.

## 2023-11-10 LAB — COMPLETE METABOLIC PANEL WITH GFR
AG Ratio: 1.5 (calc) (ref 1.0–2.5)
ALT: 16 U/L (ref 6–29)
AST: 18 U/L (ref 10–35)
Albumin: 4.8 g/dL (ref 3.6–5.1)
Alkaline phosphatase (APISO): 98 U/L (ref 37–153)
BUN: 15 mg/dL (ref 7–25)
CO2: 29 mmol/L (ref 20–32)
Calcium: 10.1 mg/dL (ref 8.6–10.4)
Chloride: 105 mmol/L (ref 98–110)
Creat: 0.82 mg/dL (ref 0.50–1.03)
Globulin: 3.2 g/dL (ref 1.9–3.7)
Glucose, Bld: 91 mg/dL (ref 65–99)
Potassium: 4 mmol/L (ref 3.5–5.3)
Sodium: 141 mmol/L (ref 135–146)
Total Bilirubin: 0.4 mg/dL (ref 0.2–1.2)
Total Protein: 8 g/dL (ref 6.1–8.1)
eGFR: 85 mL/min/{1.73_m2} (ref >=60)

## 2023-11-10 LAB — LIPID PANEL
Cholesterol: 189 mg/dL (ref <200)
HDL: 61 mg/dL (ref >=50)
LDL Cholesterol (Calc): 107 mg/dL — ABNORMAL HIGH
Non-HDL Cholesterol (Calc): 128 mg/dL (ref <130)
Total CHOL/HDL Ratio: 3.1 (calc) (ref <5.0)
Triglycerides: 111 mg/dL (ref <150)

## 2023-11-10 LAB — HEPATITIS C ANTIBODY: Hepatitis C Ab: NONREACTIVE

## 2023-11-10 LAB — CBC WITH DIFFERENTIAL/PLATELET
Absolute Lymphocytes: 2725 {cells}/uL (ref 850–3900)
Absolute Monocytes: 359 {cells}/uL (ref 200–950)
Basophils Absolute: 29 {cells}/uL (ref 0–200)
Basophils Relative: 0.5 %
Eosinophils Absolute: 131 {cells}/uL (ref 15–500)
Eosinophils Relative: 2.3 %
HCT: 44.8 % (ref 35.0–45.0)
Hemoglobin: 15 g/dL (ref 11.7–15.5)
MCH: 28.8 pg (ref 27.0–33.0)
MCHC: 33.5 g/dL (ref 32.0–36.0)
MCV: 86.2 fL (ref 80.0–100.0)
MPV: 10.8 fL (ref 7.5–12.5)
Monocytes Relative: 6.3 %
Neutro Abs: 2457 {cells}/uL (ref 1500–7800)
Neutrophils Relative %: 43.1 %
Platelets: 342 10*3/uL (ref 140–400)
RBC: 5.2 10*6/uL — ABNORMAL HIGH (ref 3.80–5.10)
RDW: 13.2 % (ref 11.0–15.0)
Total Lymphocyte: 47.8 %
WBC: 5.7 10*3/uL (ref 3.8–10.8)

## 2023-11-14 ENCOUNTER — Ambulatory Visit
Admission: RE | Admit: 2023-11-14 | Discharge: 2023-11-14 | Disposition: A | Discharge status: Home / Self Care | Payer: Medicaid Other | Source: Ambulatory Visit | Attending: Internal Medicine | Admitting: Internal Medicine

## 2023-11-14 DIAGNOSIS — R140 Abdominal distension (gaseous): Secondary | ICD-10-CM | POA: Diagnosis present

## 2023-11-14 DIAGNOSIS — D219 Benign neoplasm of connective and other soft tissue, unspecified: Secondary | ICD-10-CM | POA: Diagnosis present

## 2023-11-15 ENCOUNTER — Encounter: Payer: Self-pay | Admitting: Emergency Medicine

## 2023-12-29 ENCOUNTER — Other Ambulatory Visit: Payer: Self-pay

## 2023-12-29 ENCOUNTER — Ambulatory Visit (INDEPENDENT_AMBULATORY_CARE_PROVIDER_SITE_OTHER): Admitting: Internal Medicine

## 2023-12-29 ENCOUNTER — Telehealth: Payer: Self-pay

## 2023-12-29 ENCOUNTER — Encounter: Payer: Self-pay | Admitting: Internal Medicine

## 2023-12-29 VITALS — BP 120/80 | HR 98 | Temp 98.0°F | Resp 16 | Ht 63.0 in | Wt 166.4 lb

## 2023-12-29 DIAGNOSIS — Z1211 Encounter for screening for malignant neoplasm of colon: Secondary | ICD-10-CM

## 2023-12-29 DIAGNOSIS — K5904 Chronic idiopathic constipation: Secondary | ICD-10-CM | POA: Diagnosis not present

## 2023-12-29 MED ORDER — POLYETHYLENE GLYCOL 3350 17 GM/SCOOP PO POWD: 17.0000 g | Freq: Two times a day (BID) | ORAL | 1 refills | Status: DC | PRN

## 2023-12-29 MED ORDER — SENNA-DOCUSATE SODIUM 8.6-50 MG PO TABS: 1.0000 | ORAL_TABLET | Freq: Every day | ORAL | 1 refills | Status: DC

## 2023-12-29 MED ORDER — GOLYTELY 236 G PO SOLR: 4000.0000 mL | Freq: Once | ORAL | 0 refills | Status: AC

## 2023-12-29 NOTE — Patient Instructions (Signed)

## 2023-12-29 NOTE — Telephone Encounter (Signed)
 Gastroenterology Pre-Procedure Review  Request Date: 01/24/24 Requesting Physician: Dr. Servando Snare  PATIENT REVIEW QUESTIONS: The patient responded to the following health history questions as indicated:    1. Are you having any GI issues? Yes constipation.  Pcp has prescribed miralax and senokot to address 2. Do you have a personal history of Polyps? no 3. Do you have a family history of Colon Cancer or Polyps? no 4. Diabetes Mellitus? no 5. Joint replacements in the past 12 months?no 6. Major health problems in the past 3 months?no 7. Any artificial heart valves, MVP, or defibrillator?no    MEDICATIONS & ALLERGIES:    Patient reports the following regarding taking any anticoagulation/antiplatelet therapy:   Plavix, Coumadin, Eliquis, Xarelto, Lovenox, Pradaxa, Brilinta, or Effient? no Aspirin? no  Patient confirms/reports the following medications:  Current Outpatient Medications  Medication Sig Dispense Refill   amLODipine (NORVASC) 10 MG tablet Take 1 tablet (10 mg total) by mouth daily. for high blood pressure 30 tablet 0   polyethylene glycol powder (GLYCOLAX/MIRALAX) 17 GM/SCOOP powder Take 17 g by mouth 2 (two) times daily as needed for moderate constipation. 3350 g 1   sennosides-docusate sodium (SENOKOT-S) 8.6-50 MG tablet Take 1 tablet by mouth daily. 30 tablet 1   No current facility-administered medications for this visit.    Patient confirms/reports the following allergies:  Allergies  Allergen Reactions   Shellfish Allergy Anaphylaxis    No orders of the defined types were placed in this encounter.   AUTHORIZATION INFORMATION Primary Insurance: 1D#: Group #:  Secondary Insurance: 1D#: Group #:  SCHEDULE INFORMATION: Date: 01/24/24 Time: Location: [facility]

## 2023-12-29 NOTE — Progress Notes (Signed)
   Acute Office Visit  Subjective:     Patient ID: Angela Cain, female    DOB: 28-Sep-1971, 53 y.o.   MRN: [id number]  Chief Complaint  Patient presents with   Constipation    HPI Patient is in today for constipation. Appears to be chronic but worse lately. She is having BM's every day but only a small amount with straining. Also feeling very bloated, uncomfortable but no abdominal pain. No bleeding or rectal pain. Feels like she is having to urinate more often. Drinking prune juice, coffee with carrow syrup every day since Sunday without results.    Review of Systems  Constitutional:  Negative for chills and fever.  Gastrointestinal:  Positive for constipation. Negative for abdominal pain, blood in stool, diarrhea, melena and nausea.  Genitourinary:  Positive for urgency.        Objective:    Pulse 98   Temp 98 F (36.7 C) (Oral)   Resp 16   Ht 5\' 3"  (1.6 m)   Wt 166 lb 6.4 oz (75.5 kg)   SpO2 98%   BMI 29.48 kg/m    Physical Exam Constitutional:      Appearance: Normal appearance.  HENT:     Head: Normocephalic and atraumatic.  Eyes:     Conjunctiva/sclera: Conjunctivae normal.  Cardiovascular:     Rate and Rhythm: Normal rate and regular rhythm.  Pulmonary:     Effort: Pulmonary effort is normal.     Breath sounds: Normal breath sounds.  Abdominal:     General: There is distension.     Palpations: Abdomen is soft. There is no mass.     Tenderness: There is no abdominal tenderness. There is no guarding or rebound.     Hernia: No hernia is present.     Comments: Hypoactive bowel signs with mild bloating and discomfort in the LLQ but no hard stool palpated  Skin:    General: Skin is warm and dry.  Neurological:     General: No focal deficit present.     Mental Status: She is alert. Mental status is at baseline.  Psychiatric:        Mood and Affect: Mood normal.        Behavior: Behavior normal.     No results found for any visits on  12/29/23.      Assessment & Plan:   1. Chronic idiopathic constipation (Primary): Abdominal exam with hypoactive bowel sounds and mild bloating. Continue prune juice and hydration, will start Miralax with a goal to have one soft BM a day. Can also use stool softener as needed. If constipation worsen she will call.   - polyethylene glycol powder (GLYCOLAX/MIRALAX) 17 GM/SCOOP powder; Take 17 g by mouth 2 (two) times daily as needed for moderate constipation.  Dispense: 3350 g; Refill: 1 - sennosides-docusate sodium (SENOKOT-S) 8.6-50 MG tablet; Take 1 tablet by mouth daily.  Dispense: 30 tablet; Refill: 1  2. Colon cancer screening: Referral placed for screening.   - Ambulatory referral to Gastroenterology   Return if symptoms worsen or fail to improve.  Margarita Mail, DO

## 2024-01-23 ENCOUNTER — Encounter: Payer: Self-pay | Admitting: Gastroenterology

## 2024-01-23 ENCOUNTER — Telehealth: Payer: Self-pay

## 2024-01-23 NOTE — Telephone Encounter (Signed)
 Pts colonoscopy has been rescheduled from 04/22 with Dr. Ole Berkeley to 01/25/24 with Dr. Antony Baumgartner due to she misplaced her instructions and ate chicken this morning for breakfast.  Instructions will be emailed to her with new date to : [email or web address]

## 2024-01-23 NOTE — Telephone Encounter (Signed)
 Okay

## 2024-01-23 NOTE — Telephone Encounter (Signed)
 Pt requesting call back with prep questions about procedure

## 2024-01-25 ENCOUNTER — Encounter
Admission: RE | Disposition: A | Discharge status: Home / Self Care | Payer: Self-pay | Source: Home / Self Care | Attending: Gastroenterology

## 2024-01-25 ENCOUNTER — Ambulatory Visit: Admitting: Certified Registered"

## 2024-01-25 ENCOUNTER — Encounter: Payer: Self-pay | Admitting: Gastroenterology

## 2024-01-25 ENCOUNTER — Ambulatory Visit
Admission: RE | Admit: 2024-01-25 | Discharge: 2024-01-25 | Disposition: A | Discharge status: Home / Self Care | Attending: Gastroenterology | Admitting: Gastroenterology

## 2024-01-25 DIAGNOSIS — Z79899 Other long term (current) drug therapy: Secondary | ICD-10-CM | POA: Diagnosis not present

## 2024-01-25 DIAGNOSIS — K5730 Diverticulosis of large intestine without perforation or abscess without bleeding: Secondary | ICD-10-CM | POA: Diagnosis not present

## 2024-01-25 DIAGNOSIS — K635 Polyp of colon: Secondary | ICD-10-CM | POA: Diagnosis not present

## 2024-01-25 DIAGNOSIS — I10 Essential (primary) hypertension: Secondary | ICD-10-CM | POA: Insufficient documentation

## 2024-01-25 DIAGNOSIS — Z1211 Encounter for screening for malignant neoplasm of colon: Secondary | ICD-10-CM | POA: Diagnosis present

## 2024-01-25 DIAGNOSIS — Z7951 Long term (current) use of inhaled steroids: Secondary | ICD-10-CM | POA: Insufficient documentation

## 2024-01-25 DIAGNOSIS — Z87891 Personal history of nicotine dependence: Secondary | ICD-10-CM | POA: Insufficient documentation

## 2024-01-25 DIAGNOSIS — D126 Benign neoplasm of colon, unspecified: Secondary | ICD-10-CM

## 2024-01-25 HISTORY — PX: COLONOSCOPY: SHX5424

## 2024-01-25 HISTORY — DX: Tobacco use: Z72.0

## 2024-01-25 HISTORY — PX: POLYPECTOMY: SHX149

## 2024-01-25 SURGERY — COLONOSCOPY
Anesthesia: General

## 2024-01-25 MED ORDER — DEXMEDETOMIDINE HCL IN NACL 80 MCG/20ML IV SOLN
INTRAVENOUS | Status: DC | PRN
  Administered 2024-01-25: 8 ug via INTRAVENOUS

## 2024-01-25 MED ORDER — SODIUM CHLORIDE 0.9 % IV SOLN: INTRAVENOUS | Status: DC

## 2024-01-25 MED ORDER — PROPOFOL 500 MG/50ML IV EMUL
INTRAVENOUS | Status: DC | PRN
  Administered 2024-01-25: 150 ug/kg/min via INTRAVENOUS

## 2024-01-25 MED ORDER — PROPOFOL 10 MG/ML IV BOLUS
INTRAVENOUS | Status: AC
  Filled 2024-01-25: qty 20

## 2024-01-25 MED ORDER — LIDOCAINE HCL (CARDIAC) PF 100 MG/5ML IV SOSY
PREFILLED_SYRINGE | INTRAVENOUS | Status: DC | PRN
  Administered 2024-01-25: 50 mg via INTRAVENOUS

## 2024-01-25 MED ORDER — PROPOFOL 10 MG/ML IV BOLUS
INTRAVENOUS | Status: DC | PRN
  Administered 2024-01-25: 60 mg via INTRAVENOUS

## 2024-01-25 NOTE — Anesthesia Procedure Notes (Signed)
 Procedure Name: MAC Date/Time: 01/25/2024 3:08 PM  Performed by: Orin Birk, CRNAPre-anesthesia Checklist: Patient identified, Emergency Drugs available, Suction available and Patient being monitored Patient Re-evaluated:Patient Re-evaluated prior to induction Oxygen Delivery Method: Nasal cannula

## 2024-01-25 NOTE — Op Note (Signed)
 [facility] Gastroenterology Patient Name: Angela Cain Procedure Date: 01/25/2024 2:31 PM MRN: [id number] Account #: [id number] Date of Birth: 1971-01-02 Admit Type: Outpatient Age: 53 Room: [facility] ENDO ROOM 3 Gender: Female Note Status: Finalized Instrument Name: Charlyn Cooley [id number] Procedure:             Colonoscopy Indications:           Screening for colorectal malignant neoplasm Providers:             Luke Salaam MD, MD Referring MD:          Rockney Cid (Referring MD) Medicines:             Monitored Anesthesia Care Complications:         No immediate complications. Procedure:             Pre-Anesthesia Assessment:                        - Prior to the procedure, a History and Physical was                         performed, and patient medications, allergies and                         sensitivities were reviewed. The patient's tolerance                         of previous anesthesia was reviewed.                        - The risks and benefits of the procedure and the                         sedation options and risks were discussed with the                         patient. All questions were answered and informed                         consent was obtained.                        - ASA Grade Assessment: II - A patient with mild                         systemic disease.                        After obtaining informed consent, the colonoscope was                         passed under direct vision. Throughout the procedure,                         the patient's blood pressure, pulse, and oxygen                         saturations were monitored continuously. The                         Colonoscope was introduced through  the anus and                         advanced to the the cecum, identified by the                         appendiceal orifice. The colonoscopy was performed                         with ease. The patient tolerated the  procedure well.                         The quality of the bowel preparation was excellent. Findings:      The perianal and digital rectal examinations were normal.      A 3 mm polyp was found in the sigmoid colon. The polyp was sessile. The       polyp was removed with a jumbo cold forceps. Resection and retrieval       were complete.      Multiple medium-mouthed diverticula were found in the left colon.      The exam was otherwise without abnormality on direct and retroflexion       views. Impression:            - One 3 mm polyp in the sigmoid colon, removed with a                         jumbo cold forceps. Resected and retrieved.                        - Diverticulosis in the left colon.                        - The examination was otherwise normal on direct and                         retroflexion views. Recommendation:        - Discharge patient to home (with escort).                        - Resume previous diet.                        - Continue present medications.                        - Await pathology results.                        - Repeat colonoscopy for surveillance based on                         pathology results. Procedure Code(s):     --- Professional ---                        [phone], Colonoscopy, flexible; with biopsy, single or                         multiple Diagnosis Code(s):     --- Professional ---  Z12.11, Encounter for screening for malignant neoplasm                         of colon                        D12.5, Benign neoplasm of sigmoid colon CPT copyright 2022 American Medical Association. All rights reserved. The codes documented in this report are preliminary and upon coder review may  be revised to meet current compliance requirements. Luke Salaam, MD Luke Salaam MD, MD 01/25/2024 3:20:26 PM This report has been signed electronically. Number of Addenda: 0 Note Initiated On: 01/25/2024 2:31 PM Scope Withdrawal Time: 0 hours 9  minutes 22 seconds  Total Procedure Duration: 0 hours 10 minutes 54 seconds  Estimated Blood Loss:  Estimated blood loss: none.      [facility]

## 2024-01-25 NOTE — Anesthesia Postprocedure Evaluation (Signed)
 Anesthesia Post Note  Patient: Angela Cain  Procedure(s) Performed: COLONOSCOPY POLYPECTOMY, INTESTINE  Patient location during evaluation: Endoscopy Anesthesia Type: General Level of consciousness: awake and alert Pain management: pain level controlled Vital Signs Assessment: post-procedure vital signs reviewed and stable Respiratory status: spontaneous breathing, nonlabored ventilation, respiratory function stable and patient connected to nasal cannula oxygen Cardiovascular status: blood pressure returned to baseline and stable Postop Assessment: no apparent nausea or vomiting Anesthetic complications: no  No notable events documented.   Last Vitals:  Vitals:   01/25/24 1522 01/25/24 1542  BP:  (!) 136/90  Pulse:    Resp:    Temp: (!) 36.3 C   SpO2:  100%    Last Pain:  Vitals:   01/25/24 1542  TempSrc:   PainSc: 0-No pain                 Enrique Harvest

## 2024-01-25 NOTE — H&P (Signed)
 Angela Salaam, MD [address], [place], [place], [address] [address], [place], [place], [address] Phone: [phone]  Fax: [phone]  Primary Care Physician:  Rockney Cid, DO   Pre-Procedure History & Physical: HPI:  Angela Cain is a 53 y.o. female is here for an colonoscopy.   Past Medical History:  Diagnosis Date   Hypertension    Tobacco use     Past Surgical History:  Procedure Laterality Date   BREAST BIOPSY Left 11/02/2023   us  bx 3:00 ribbon path pending   BREAST BIOPSY Left 11/02/2023   us  bx 9:00 coil path pending   BREAST BIOPSY Left 11/02/2023   us  bx 10:00 venus path pending   BREAST BIOPSY Left 11/02/2023   US  LT BREAST BX W LOC DEV 1ST LESION IMG BX SPEC US  GUIDE 11/02/2023 [facility]   BREAST BIOPSY Left 11/02/2023   US  LT BREAST BX W LOC DEV EA ADD LESION IMG BX SPEC US  GUIDE 11/02/2023 [facility]   BREAST BIOPSY Left 11/02/2023   US  LT BREAST BX W LOC DEV EA ADD LESION IMG BX SPEC US  GUIDE 11/02/2023 [facility]    Prior to Admission medications   Medication Sig Start Date End Date Taking? Authorizing Provider  albuterol (VENTOLIN HFA) 108 (90 Base) MCG/ACT inhaler Inhale into the lungs every 6 (six) hours as needed for wheezing or shortness of breath.   Yes [provider]  meloxicam (MOBIC) 15 MG tablet Take 15 mg by mouth daily.   Yes [provider]  omeprazole (PRILOSEC) 40 MG capsule Take 40 mg by mouth daily.   Yes [provider]  amLODipine  (NORVASC ) 10 MG tablet Take 1 tablet (10 mg total) by mouth daily. for high blood pressure 04/28/22   Triplett, Cari B, FNP  polyethylene glycol powder (GLYCOLAX /MIRALAX ) 17 GM/SCOOP powder Take 17 g by mouth 2 (two) times daily as needed for moderate constipation. 12/29/23   Rockney Cid, DO  sennosides-docusate sodium  (SENOKOT-S) 8.6-50 MG tablet Take 1 tablet by mouth daily. 12/29/23   Rockney Cid, DO     Allergies as of 12/30/2023 - Review Complete 12/29/2023  Allergen Reaction Noted   Shellfish allergy Anaphylaxis 06/30/2022    Family History  Problem Relation Age of Onset   Breast cancer Sister 60    Social History   Socioeconomic History   Marital status: Married    Spouse name: Not on file   Number of children: Not on file   Years of education: Not on file   Highest education level: Not on file  Occupational History   Not on file  Tobacco Use   Smoking status: Former    Types: Cigarettes    Passive exposure: Past   Smokeless tobacco: Never  Vaping Use   Vaping status: Never Used  Substance and Sexual Activity   Alcohol use: Not Currently    Comment: rarely   Drug use: Yes    Types: Marijuana   Sexual activity: Not Currently  Other Topics Concern   Not on file  Social History Narrative   Not on file   Social Drivers of Health   Financial Resource Strain: Not on file  Food Insecurity: Not on file  Transportation Needs: Not on file  Physical Activity: Not on file  Stress: Not on file  Social Connections: Not on file  Intimate Partner Violence: Not on file    Review of Systems: See HPI, otherwise negative ROS  Physical Exam: BP Aaron Aas)  163/114   Pulse 83   Temp (!) 97.3 F (36.3 C) (Temporal)   Resp 17   Ht 5\' 3"  (1.6 m)   Wt 72.6 kg   SpO2 100%   BMI 28.34 kg/m  General:   Alert,  pleasant and cooperative in NAD Head:  Normocephalic and atraumatic. Neck:  Supple; no masses or thyromegaly. Lungs:  Clear throughout to auscultation, normal respiratory effort.    Heart:  +S1, +S2, Regular rate and rhythm, No edema. Abdomen:  Soft, nontender and nondistended. Normal bowel sounds, without guarding, and without rebound.   Neurologic:  Alert and  oriented x4;  grossly normal neurologically.  Impression/Plan: Angela Cain is here for an colonoscopy to be performed for Screening colonoscopy average risk   Risks, benefits, limitations, and  alternatives regarding  colonoscopy have been reviewed with the patient.  Questions have been answered.  All parties agreeable.   Angela Salaam, MD  01/25/2024, 2:40 PM

## 2024-01-25 NOTE — Anesthesia Preprocedure Evaluation (Signed)
 Anesthesia Evaluation  Patient identified by MRN, date of birth, ID band Patient awake    Reviewed: Allergy & Precautions, H&P , NPO status , Patient's Chart, lab work & pertinent test results, reviewed documented beta blocker date and time   History of Anesthesia Complications Negative for: history of anesthetic complications  Airway Mallampati: III  TM Distance: >3 FB Neck ROM: full    Dental  (+) Dental Advidsory Given, Missing, Chipped   Pulmonary neg pulmonary ROS, former smoker   Pulmonary exam normal breath sounds clear to auscultation       Cardiovascular Exercise Tolerance: Good hypertension, (-) angina (-) Past MI and (-) Cardiac Stents Normal cardiovascular exam(-) dysrhythmias (-) Valvular Problems/Murmurs Rhythm:regular Rate:Normal     Neuro/Psych negative neurological ROS  negative psych ROS   GI/Hepatic negative GI ROS, Neg liver ROS,,,  Endo/Other  negative endocrine ROS    Renal/GU negative Renal ROS  negative genitourinary   Musculoskeletal   Abdominal   Peds  Hematology negative hematology ROS (+)   Anesthesia Other Findings Past Medical History: No date: Hypertension No date: Tobacco use   Reproductive/Obstetrics negative OB ROS                             Anesthesia Physical Anesthesia Plan  ASA: 2  Anesthesia Plan: General   Post-op Pain Management:    Induction: Intravenous  PONV Risk Score and Plan: 3 and Propofol  infusion, TIVA and Treatment may vary due to age or medical condition  Airway Management Planned: Natural Airway and Nasal Cannula  Additional Equipment:   Intra-op Plan:   Post-operative Plan:   Informed Consent: I have reviewed the patients History and Physical, chart, labs and discussed the procedure including the risks, benefits and alternatives for the proposed anesthesia with the patient or authorized representative who has indicated  his/her understanding and acceptance.     Dental Advisory Given  Plan Discussed with: Anesthesiologist, CRNA and Surgeon  Anesthesia Plan Comments:        Anesthesia Quick Evaluation

## 2024-01-25 NOTE — Transfer of Care (Signed)
 Immediate Anesthesia Transfer of Care Note  Patient: Angela Cain  Procedure(s) Performed: COLONOSCOPY POLYPECTOMY, INTESTINE  Patient Location: PACU and Endoscopy Unit  Anesthesia Type:General  Level of Consciousness: drowsy and patient cooperative  Airway & Oxygen Therapy: Patient Spontanous Breathing  Post-op Assessment: Report given to RN and Post -op Vital signs reviewed and stable  Post vital signs: Reviewed and stable  Last Vitals:  Vitals Value Taken Time  BP 126/84 01/25/24 1522  Temp    Pulse 78 01/25/24 1522  Resp 18 01/25/24 1522  SpO2 100 % 01/25/24 1522  Vitals shown include unfiled device data.  Last Pain:  Vitals:   01/25/24 1340  TempSrc: Temporal  PainSc: 0-No pain         Complications: No notable events documented.

## 2024-01-26 ENCOUNTER — Encounter: Payer: Self-pay | Admitting: Gastroenterology

## 2024-01-26 LAB — SURGICAL PATHOLOGY

## 2024-01-31 ENCOUNTER — Other Ambulatory Visit: Payer: Self-pay | Admitting: Student

## 2024-01-31 DIAGNOSIS — H9011 Conductive hearing loss, unilateral, right ear, with unrestricted hearing on the contralateral side: Secondary | ICD-10-CM

## 2024-02-01 ENCOUNTER — Encounter: Payer: Self-pay | Admitting: Gastroenterology

## 2024-02-02 ENCOUNTER — Encounter: Payer: Self-pay | Admitting: Student

## 2024-02-06 ENCOUNTER — Inpatient Hospital Stay: Admission: RE | Admit: 2024-02-06 | Source: Ambulatory Visit

## 2024-02-08 ENCOUNTER — Encounter: Payer: Self-pay | Admitting: Student

## 2024-03-21 ENCOUNTER — Encounter: Payer: Self-pay | Admitting: Student

## 2024-04-05 ENCOUNTER — Ambulatory Visit: Payer: Self-pay

## 2024-04-05 NOTE — Telephone Encounter (Signed)
 FYI Only or Action Required?: Action required by provider: request for appointment.  Patient was last seen in primary care on 12/29/2023 by Bernardo Fend, DO. Called Nurse Triage reporting Mass. Symptoms began a week ago. Interventions attempted: Nothing. Symptoms are: stable.  Triage Disposition: See Physician Within 24 Hours  Patient/caregiver understands and will follow disposition?: Yes                             Copied from CRM [id number]. Topic: Clinical - Red Word Triage >> Apr 05, 2024  3:23 PM Angela Cain wrote: Red Word that prompted transfer to Nurse Triage: bump has busted and green stuff coming out. bump had for 5 years and busted cux it touched it. whole alot came out. having pain Reason for Disposition  [1] Swelling is painful to touch AND [2] no fever  Answer Assessment - Initial Assessment Questions 1. APPEARANCE of SWELLING: What does it look like?     States bump is raised 2. SIZE: How large is the swelling? (e.g., inches, cm; or compare to size of pinhead, tip of pen, eraser, coin, pea, grape, ping pong ball)      About the size of a nickel 3. LOCATION: Where is the swelling located?     Back of left shoulder  4. ONSET: When did the swelling start?     States she has had the bump for a few years, states she mashed the bump last week and now the area is painful and drainage 5. COLOR: What color is it? Is there more than one color?     Area around bump is red and black, green drainage  6. PAIN: Is there any pain? If Yes, ask: How bad is the pain? (e.g., scale 1-10; or mild, moderate, severe)     - NONE (0): no pain   - MILD (1-3): doesn't interfere with normal activities    - MODERATE (4-7): interferes with normal activities or awakens from sleep    - SEVERE (8-10): excruciating pain, unable to do any normal activities     Painful to the touch 7. ITCH: Does it itch? If Yes, ask: How bad is the itch?      Denies 8. CAUSE:  What do you think caused the swelling?     Unknown 9 OTHER SYMPTOMS: Do you have any other symptoms? (e.g., fever)     Generalized fatigue, lack of appetite    Please call patient to assist with scheduling. Offered first available appointment in office, but patient stated she is going out of town next Thursday.  Protocols used: Skin Lump or Localized Swelling-A-AH

## 2024-04-05 NOTE — Telephone Encounter (Signed)
 Called patient, we will see how it does over the weekend if not better will call in Monday for any cancellations

## 2024-05-08 ENCOUNTER — Ambulatory Visit (INDEPENDENT_AMBULATORY_CARE_PROVIDER_SITE_OTHER): Payer: Medicaid Other | Admitting: Internal Medicine

## 2024-05-08 ENCOUNTER — Other Ambulatory Visit: Payer: Self-pay

## 2024-05-08 ENCOUNTER — Encounter: Payer: Self-pay | Admitting: Internal Medicine

## 2024-05-08 VITALS — BP 124/82 | HR 79 | Temp 97.9°F | Resp 16 | Ht 63.0 in | Wt 155.0 lb

## 2024-05-08 DIAGNOSIS — I10 Essential (primary) hypertension: Secondary | ICD-10-CM

## 2024-05-08 DIAGNOSIS — D219 Benign neoplasm of connective and other soft tissue, unspecified: Secondary | ICD-10-CM

## 2024-05-08 DIAGNOSIS — N950 Postmenopausal bleeding: Secondary | ICD-10-CM | POA: Diagnosis not present

## 2024-05-08 MED ORDER — AMLODIPINE BESYLATE 10 MG PO TABS: 10.0000 mg | ORAL_TABLET | Freq: Every day | ORAL | 1 refills | Status: AC

## 2024-05-08 NOTE — Progress Notes (Signed)
 Chronic Patient Office Visit  Subjective    Patient ID: Angela Cain, female    DOB: 1971/06/08  Age: 53 y.o. MRN: [id number]  CC:  Chief Complaint  Patient presents with   Medical Management of Chronic Issues    6 month recheck    HPI Angela Cain presents for follow up on chronic medical conditions.   Discussed the use of AI scribe software for clinical note transcription with the patient, who gave verbal consent to proceed.  History of Present Illness Angela Cain is a 52 year old female with fibroids who presents with postmenopausal bleeding.  She experienced an unexpected return of her menstrual period last Monday, lasting five days. Her fibroids have been previously evaluated via ultrasound and were described as large enough to obscure her cervix or uterus, but they are non-cancerous. She had a Pap smear within the last year at [facility], though results are not available. She recalls being sent for further evaluation a couple of years ago due to the fibroids. She is currently taking amlodipine  for blood pressure management without issues. She is not using omeprazole or Prilosec for acid reflux and declines further vaccinations, having received the COVID-19 vaccine and flu shot.   Hypertension: -Medications: Amlodipine  10 mg -Patient is compliant with above medications and reports no side effects. -Checking BP at home (average): not checking -Denies any SOB, CP, vision changes, LE edema or symptoms of hypotension  Health Maintenance: -Blood work UTD -Mammogram 1/25 Birads-4, had biopsy which showed fibroadenoma, negative for malignancy  -Colon cancer screening due -Pap UTD, obtaining records  -Declines all vaccines, did discuss getting shingles vaccine at the pharmacy.   Outpatient Encounter Medications as of 05/08/2024  Medication Sig   albuterol (VENTOLIN HFA) 108 (90 Base) MCG/ACT inhaler Inhale into the lungs every 6 (six) hours  as needed for wheezing or shortness of breath.   amLODipine  (NORVASC ) 10 MG tablet Take 1 tablet (10 mg total) by mouth daily. for high blood pressure   meloxicam (MOBIC) 15 MG tablet Take 15 mg by mouth daily.   omeprazole (PRILOSEC) 40 MG capsule Take 40 mg by mouth daily.   polyethylene glycol powder (GLYCOLAX /MIRALAX ) 17 GM/SCOOP powder Take 17 g by mouth 2 (two) times daily as needed for moderate constipation.   sennosides-docusate sodium  (SENOKOT-S) 8.6-50 MG tablet Take 1 tablet by mouth daily.   No facility-administered encounter medications on file as of 05/08/2024.    Past Medical History:  Diagnosis Date   Hypertension    Tobacco use     Past Surgical History:  Procedure Laterality Date   BREAST BIOPSY Left 11/02/2023   us  bx 3:00 ribbon path pending   BREAST BIOPSY Left 11/02/2023   us  bx 9:00 coil path pending   BREAST BIOPSY Left 11/02/2023   us  bx 10:00 venus path pending   BREAST BIOPSY Left 11/02/2023   US  LT BREAST BX W LOC DEV 1ST LESION IMG BX SPEC US  GUIDE 11/02/2023 [facility]   BREAST BIOPSY Left 11/02/2023   US  LT BREAST BX W LOC DEV EA ADD LESION IMG BX SPEC US  GUIDE 11/02/2023 [facility]   BREAST BIOPSY Left 11/02/2023   US  LT BREAST BX W LOC DEV EA ADD LESION IMG BX SPEC US  GUIDE 11/02/2023 [facility]   COLONOSCOPY N/A 01/25/2024   Procedure: COLONOSCOPY;  Surgeon: Therisa Bi, MD;  Location: [facility] ENDOSCOPY;  Service: Endoscopy;  Laterality: N/A;   POLYPECTOMY  01/25/2024   Procedure: POLYPECTOMY, INTESTINE;  Surgeon: Therisa,  Ruel, MD;  Location: [facility] ENDOSCOPY;  Service: Endoscopy;;    Family History  Problem Relation Age of Onset   Breast cancer Sister 13    Social History   Socioeconomic History   Marital status: Married    Spouse name: Not on file   Number of children: Not on file   Years of education: Not on file   Highest education level: Not on file  Occupational History   Not on file  Tobacco Use   Smoking status: Former     Types: Cigarettes    Passive exposure: Past   Smokeless tobacco: Never  Vaping Use   Vaping status: Never Used  Substance and Sexual Activity   Alcohol use: Not Currently    Comment: rarely   Drug use: Yes    Types: Marijuana   Sexual activity: Not Currently  Other Topics Concern   Not on file  Social History Narrative   Not on file   Social Drivers of Health   Financial Resource Strain: Not on file  Food Insecurity: Not on file  Transportation Needs: Not on file  Physical Activity: Not on file  Stress: Not on file  Social Connections: Not on file  Intimate Partner Violence: Not on file    Review of Systems  All other systems reviewed and are negative.       Objective    BP 124/82 (Cuff Size: Normal)   Pulse 79   Temp 97.9 F (36.6 C) (Oral)   Resp 16   Ht 5' 3 (1.6 m)   Wt 155 lb (70.3 kg)   SpO2 98%   BMI 27.46 kg/m   Physical Exam Constitutional:      Appearance: Normal appearance.  HENT:     Head: Normocephalic and atraumatic.  Eyes:     Conjunctiva/sclera: Conjunctivae normal.  Cardiovascular:     Rate and Rhythm: Normal rate and regular rhythm.  Pulmonary:     Effort: Pulmonary effort is normal.     Breath sounds: Normal breath sounds.  Skin:    General: Skin is warm and dry.  Neurological:     General: No focal deficit present.     Mental Status: She is alert. Mental status is at baseline.  Psychiatric:        Mood and Affect: Mood normal.        Behavior: Behavior normal.         Assessment & Plan:   Assessment & Plan Postmenopausal bleeding Postmenopausal bleeding with recent episode. Differential includes fibroid-related bleeding or other gynecological issues. Further evaluation needed to rule out significant pathology. - Order pelvic ultrasound to assess fibroid size and check for other potential causes of bleeding. - Obtain Pap smear results from [facility] for review. - Consider referral to a gynecologist based on  ultrasound and Pap smear results.  Uterine fibroids Large non-cancerous uterine fibroids identified on previous US . Recent bleeding suggests further evaluation despite typical postmenopausal regression due to decreased estrogen. - Order pelvic ultrasound to assess current size and status of fibroids. - Discuss potential gynecological referral for possible fibroid removal if indicated by ultrasound results.  Hypertension - Blood pressure stable here today, no changes made to medications and appropriate refills sent to pharmacy.    - US  PELVIC COMPLETE WITH TRANSVAGINAL; Future - amLODipine  (NORVASC ) 10 MG tablet; Take 1 tablet (10 mg total) by mouth daily. for high blood pressure  Dispense: 90 tablet; Refill: 1    Return in about 6 months (around  11/08/2024).   Sharyle Fischer, DO

## 2024-05-09 ENCOUNTER — Other Ambulatory Visit: Payer: Self-pay | Admitting: Internal Medicine

## 2024-05-14 ENCOUNTER — Ambulatory Visit

## 2024-08-10 ENCOUNTER — Emergency Department
Admission: EM | Admit: 2024-08-10 | Discharge: 2024-08-10 | Disposition: A | Discharge status: Home / Self Care | Attending: Emergency Medicine | Admitting: Emergency Medicine

## 2024-08-10 ENCOUNTER — Other Ambulatory Visit: Payer: Self-pay

## 2024-08-10 DIAGNOSIS — I10 Essential (primary) hypertension: Secondary | ICD-10-CM | POA: Insufficient documentation

## 2024-08-10 DIAGNOSIS — K029 Dental caries, unspecified: Secondary | ICD-10-CM | POA: Insufficient documentation

## 2024-08-10 DIAGNOSIS — B351 Tinea unguium: Secondary | ICD-10-CM | POA: Diagnosis not present

## 2024-08-10 DIAGNOSIS — K0889 Other specified disorders of teeth and supporting structures: Secondary | ICD-10-CM | POA: Diagnosis present

## 2024-08-10 DIAGNOSIS — K047 Periapical abscess without sinus: Secondary | ICD-10-CM | POA: Diagnosis not present

## 2024-08-10 MED ORDER — AMOXICILLIN 875 MG PO TABS: 875.0000 mg | ORAL_TABLET | Freq: Two times a day (BID) | ORAL | 0 refills | Status: DC

## 2024-08-10 NOTE — ED Notes (Signed)
 Pt given DC instruction. Pt verbalized understanding of medication and follow up care. Pt ambulatory from ED without difficulty.

## 2024-08-10 NOTE — ED Provider Notes (Signed)
 [facility] Provider Note    Event Date/Time   First MD Initiated Contact with Patient 08/10/24 0902     (approximate)   History   Dental Pain and Toe Pain   HPI  Angela Cain is a 53 y.o. female   presents to the ED with complaint of dental pain for several years.  Patient states that she generally takes a screwdriver for another tool to walk her tooth back-and-forth and pull it on her own however this tooth she has not been able to do that.  She also complains of left great toe pain without history of injury.  She states she has been taking over-the-counter medication without any relief.  Patient has a history of diverticulitis, hypertension, constipation.      Physical Exam   Triage Vital Signs: ED Triage Vitals  Encounter Vitals Group     BP 08/10/24 0851 (!) 150/103     Girls Systolic BP Percentile --      Girls Diastolic BP Percentile --      Boys Systolic BP Percentile --      Boys Diastolic BP Percentile --      Pulse Rate 08/10/24 0851 89     Resp 08/10/24 0851 16     Temp 08/10/24 0851 98 F (36.7 C)     Temp src --      SpO2 08/10/24 0851 100 %     Weight 08/10/24 0852 145 lb (65.8 kg)     Height 08/10/24 0852 5' 3.5 (1.613 m)     Head Circumference --      Peak Flow --      Pain Score 08/10/24 0852 7     Pain Loc --      Pain Education --      Exclude from Growth Chart --     Most recent vital signs: Vitals:   08/10/24 0851 08/10/24 0934  BP: (!) 150/103 (!) 152/97  Pulse: 89 73  Resp: 16 15  Temp: 98 F (36.7 C) 98.1 F (36.7 C)  SpO2: 100% 100%     General: Awake, no distress.  Alert, talkative, cooperative. CV:  Good peripheral perfusion.  Resp:  Normal effort.  Abd:  No distention.  Other:  Right lower premolar area with tooth that is below the gumline and gums are swollen in this area.  No active drainage at this time.  Moderate tenderness on palpation.  Remaining tooth particles are black.  On  examination of the left great toenail there is fungal involvement.  Also noted fungal and involvement of all 5 toes.  No evidence of infection or ingrown nail at this time.  Skin is intact and without erythema.   ED Results / Procedures / Treatments   Labs (all labs ordered are listed, but only abnormal results are displayed) Labs Reviewed - No data to display     PROCEDURES:  Critical Care performed:   Procedures   MEDICATIONS ORDERED IN ED: Medications - No data to display   IMPRESSION / MDM / ASSESSMENT AND PLAN / ED COURSE  I reviewed the triage vital signs and the nursing notes.   Differential diagnosis includes, but is not limited to, dental abscess, dental pain, dental carry, impacted tooth, gingivitis, chronic left great toe pain, fungal infection, cellulitis, ingrown toenail.   53 year old female presents to the ED with complaint of dental pain for years and also left great toe pain.  Patient was made aware that she would  need to see a dentist as the tooth that is causing problems is below the gumline and will need to see an oral surgeon.  Currently the gum is swollen and tender.  An antibiotic was sent to the pharmacy for her to begin taking.  We also discussed fungal nails which was noted on all her toes.  She was made aware that her primary care provider could put her on some medication for this if this continues to be a problem.  Patient was given a list of dental clinics including the walk-in clinics for dental care.  A prescription for amoxicillin  875 twice daily was sent to the pharmacy for her to begin taking today.     Patient's presentation is most consistent with acute, uncomplicated illness.  FINAL CLINICAL IMPRESSION(S) / ED DIAGNOSES   Final diagnoses:  Dental abscess  Pain due to dental caries  Nail fungal infection     Rx / DC Orders   ED Discharge Orders          Ordered    amoxicillin  (AMOXIL ) 875 MG tablet  2 times daily        08/10/24  9070             Note:  This document was prepared using Dragon voice recognition software and may include unintentional dictation errors.   Saunders Shona CROME, PA-C 08/10/24 1150    Claudene Rover, MD 08/10/24 [phone]

## 2024-08-10 NOTE — ED Triage Notes (Signed)
 Pt to ED for rotten tooth to right lower for years, reports needs it pulled but hates dentist. Also reports left big toe pain for weeks, denies injuries. Denies pain to toe currently.

## 2024-08-10 NOTE — Discharge Instructions (Signed)
 Follow-up with your primary care provider about your fungal infection to your toenails.  A list of dental clinics is listed on your discharge papers.  You will have to call to see if they are taking Medicaid especially the ones that take walk-ins.  A prescription for antibiotics was sent to the pharmacy for you to begin taking today and every day until completely finished.  You may take Tylenol  or ibuprofen as needed for pain.   OPTIONS FOR DENTAL FOLLOW UP CARE  [place] [facility] - Local Safety Net Dental Clinics [email or web address]   [facility] [phone])  [facility] [phone])  [place] [phone])  [facility] [phone])  [facility] [phone]) This clinic caters to the indigent population and is on a lottery system. Location: [employer] [facility], [employer], [address] [place] Clinic Hours: Wednesdays from 6pm - 9pm, patients seen by a lottery system. For dates, call or go to [email or web address] Services: Cleanings, fillings and simple extractions. Payment Options: DENTAL WORK IS FREE OF CHARGE. Bring proof of income or support. Best way to get seen: Arrive at 5:15 pm - this is a lottery, NOT first come/first serve, so arriving earlier will not increase your chances of being seen.     [facility] [phone] Select option 1 for emergencies   Location: [facility], [place], [address] [place] Clinic Hours: No walk-ins accepted - call the day before to schedule an appointment. Check in times are 9:30 am and 1:30 pm. Services: Simple extractions, temporary fillings, pulpectomy/pulp debridement, uncomplicated abscess drainage. Payment Options: PAYMENT IS DUE AT THE TIME OF SERVICE.  Fee is usually $100-200, additional  surgical procedures (e.g. abscess drainage) may be extra. Cash, checks, Visa/MasterCard accepted.  Can file Medicaid if patient is covered for dental - patient should call case worker to check. No discount for [facility] patients. Best way to get seen: MUST call the day before and get onto the schedule. Can usually be seen the next 1-2 days. No walk-ins accepted.     [facility] [phone]   Location: [facility], [address], [place] Clinic Hours: M, W, Th, F 8am or 1:30pm, Tues 9a or 1:30 - first come/first served. Services: Simple extractions, temporary fillings, uncomplicated abscess drainage.  You do not need to be an [facility] resident. Payment Options: PAYMENT IS DUE AT THE TIME OF SERVICE. Dental insurance, otherwise sliding scale - bring proof of income or support. Depending on income and treatment needed, cost is usually $50-200. Best way to get seen: Arrive early as it is first come/first served.     [facility] [phone]   Location: [address] Clinic Hours: Mon-Thu 8a-5p Services: Most basic dental services including extractions and fillings. Payment Options: PAYMENT IS DUE AT THE TIME OF SERVICE. Sliding scale, up to 50% off - bring proof if income or support. Medicaid with dental option accepted. Best way to get seen: Call to schedule an appointment, can usually be seen within 2 weeks OR they will try to see walk-ins - show up at 8a or 2p (you may have to wait).     [facility] [phone] [place] RESIDENTS ONLY   Location: [facility], [address] [place], [place] [address] Clinic Hours: By appointment only. Monday - Thursday 8am-5pm, Friday 8am-12pm Services: Cleanings, fillings,  extractions. Payment Options: PAYMENT IS DUE AT THE TIME OF SERVICE. Cash, Visa or MasterCard. Sliding scale - $30 minimum per  service. Best way to get seen: Come in to office, complete packet and make an appointment - need proof of income or support monies for each household member and proof of [facility] residence. Usually takes about a month to get in.     [facility] [phone]   Location: [address] [facility] Clinic Hours: Walk-in Urgent Care Dental Services are offered Monday-Friday mornings only. The numbers of emergencies accepted daily is limited to the number of providers available. Maximum 15 - Mondays, Wednesdays & Thursdays Maximum 10 - Tuesdays & Fridays Services: You do not need to be a [facility] resident to be seen for a dental emergency. Emergencies are defined as pain, swelling, abnormal bleeding, or dental trauma. Walkins will receive x-rays if needed. NOTE: Dental cleaning is not an emergency. Payment Options: PAYMENT IS DUE AT THE TIME OF SERVICE. Minimum co-pay is $40.00 for uninsured patients. Minimum co-pay is $3.00 for Medicaid with dental coverage. Dental Insurance is accepted and must be presented at time of visit. Medicare does not cover dental. Forms of payment: Cash, credit card, checks. Best way to get seen: If not previously registered with the clinic, walk-in dental registration begins at 7:15 am and is on a first come/first serve basis. If previously registered with the clinic, call to make an appointment.     [facility] [phone] [place] RESIDENTS ONLY   Location: [address] [place], [place] Clinic Hours: Mon-Thu 10a-2p Services: Extractions only! Payment Options: FREE (donations accepted) - bring proof of income or support Best way to get seen: Call and schedule an appointment OR come at 8am on the 1st Monday of every month (except for holidays) when it is first come/first served.     [facility] [phone]   Location: [address] [place], [place] Clinic Hours: Friday  mornings Services, Payment Options, Best way to get seen: Call for info

## 2024-10-11 ENCOUNTER — Ambulatory Visit: Payer: Self-pay

## 2024-10-11 NOTE — Telephone Encounter (Signed)
 FYI Only or Action Required?: FYI only for provider: appointment scheduled on 10/12/24.  Patient was last seen in primary care on 05/08/2024 by Bernardo Fend, DO.  Called Nurse Triage reporting Recurrent Skin Infections.  Symptoms began a while ago, but noticed it increased in size recently.  Interventions attempted: Nothing.  Symptoms are: gradually worsening.  Triage Disposition: See Physician Within 24 Hours  Patient/caregiver understands and will follow disposition?:   Reason for Disposition  Boil > 2 inches across (> 5 cm; larger than a golf ball or ping pong ball)  Answer Assessment - Initial Assessment Questions Pt calling to report Knot on back -between the lower part of her shoulder blades. Use to be a small hard knot with no pain - now experiencing a cramp in the area and knot (ball) has gotten bigger, looks like ball underneath the skin, pain 1/10, half length of pointer finger or 2 inches in diameter.   Pt advised to be seen by HCP, pt scheduled for tomorrow 10/12/24.   1. APPEARANCE of BOIL: What does the boil look like?      A ball under the skin 2. LOCATION: Where is the boil located?      Between shoulder blades  3. NUMBER: How many boils are there?      1 4. SIZE: How big is the boil? (e.g., inches, cm; compare to size of a coin or other object)     2 inches in diameter  5. ONSET: When did the boil start?     States she had a small bump for a long time and noticed recently that it is bigger 6. PAIN: Is there any pain? If Yes, ask: How bad is the pain?   (Scale 1-10; or mild, moderate, severe)     1/10  7. FEVER: Do you have a fever? If Yes, ask: What is it, how was it measured, and when did it start?      Denies  8. SOURCE: Have you been around anyone with boils or other Staph infections? Have you ever had boils before?     Deni es  9. OTHER SYMPTOMS: Do you have any other symptoms? (e.g., shaking chills, weakness, rash elsewhere on  body)     Denies  Protocols used: Boil (Skin Abscess)-A-AH  Copied from CRM #[id number]. Topic: Clinical - Red Word Triage >> Oct 11, 2024 10:27 AM Myrick T wrote: Red Word that prompted transfer to Nurse Triage: patient stated she had a knot on her back for years and in the last 2 weeks it has gotten larger and spreading. The knot is located between her shoulder blade and is full of fluid.

## 2024-10-12 ENCOUNTER — Ambulatory Visit: Admitting: Nurse Practitioner

## 2024-10-12 ENCOUNTER — Encounter: Payer: Self-pay | Admitting: Nurse Practitioner

## 2024-10-12 VITALS — BP 128/82 | HR 63 | Temp 97.0°F | Ht 63.5 in | Wt 148.0 lb

## 2024-10-12 DIAGNOSIS — D1779 Benign lipomatous neoplasm of other sites: Secondary | ICD-10-CM

## 2024-10-12 NOTE — Progress Notes (Signed)
 "  BP 128/82   Pulse 63   Temp (!) 97 F (36.1 C)   Ht 5' 3.5 (1.613 m)   Wt 148 lb (67.1 kg)   SpO2 99%   BMI 25.81 kg/m    Subjective:    Patient ID: Angela Cain, female    DOB: Jan 22, 1971, 54 y.o.   MRN: [id number]  HPI: Angela Cain is a 54 y.o. female  Chief Complaint  Patient presents with   Cyst    Pt c/o mass on left shoulder.    Discussed the use of AI scribe software for clinical note transcription with the patient, who gave verbal consent to proceed.  History of Present Illness Angela Cain is a 54 year old female who presents with a growing bump on her left upper body.  Left upper body mass - Bump present for years on the left upper body, recently increasing in size over the past couple of months - Current size approximately that of an orange - Described as a 'knot' - No pain associated with the bump, except for a single episode of cramping under the bump a couple of weeks ago - No prior workup or diagnostic studies performed - No treatments attempted for this condition         11/09/2023    9:44 AM  Depression screen PHQ 2/9  Decreased Interest 1  Down, Depressed, Hopeless 0  PHQ - 2 Score 1    Relevant past medical, surgical, family and social history reviewed and updated as indicated. Interim medical history since our last visit reviewed. Allergies and medications reviewed and updated.  Review of Systems  Ten systems reviewed and is negative except as mentioned in HPI      Objective:      BP 128/82   Pulse 63   Temp (!) 97 F (36.1 C)   Ht 5' 3.5 (1.613 m)   Wt 148 lb (67.1 kg)   SpO2 99%   BMI 25.81 kg/m    Wt Readings from Last 3 Encounters:  10/12/24 148 lb (67.1 kg)  08/10/24 145 lb (65.8 kg)  05/08/24 155 lb (70.3 kg)    Physical Exam GENERAL: Alert, cooperative, well developed, no acute distress HEENT: Normocephalic, normal oropharynx, moist mucous membranes CHEST: Clear to  auscultation bilaterally, No wheezes, rhonchi, or crackles CARDIOVASCULAR: Normal heart rate and rhythm, S1 and S2 normal without murmurs ABDOMEN: Soft, non-tender, non-distended, without organomegaly, Normal bowel sounds EXTREMITIES: No cyanosis or edema NEUROLOGICAL: Cranial nerves grossly intact, Moves all extremities without gross motor or sensory deficit Back:  left upper back near shoulder lipoma approximately the size of an orange Results for orders placed or performed in visit on 05/09/24  HM PAP SMEAR   Collection Time: 07/07/23 12:00 AM  Result Value Ref Range   HM Pap smear NILM           Assessment & Plan:   Problem List Items Addressed This Visit   None Visit Diagnoses       Lipoma of other specified sites    -  Primary   Relevant Orders   Ambulatory referral to General Surgery        Assessment and Plan Assessment & Plan Lipoma 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 Non-cancerous, but can increase in size. No pain reported, but recent cramp under the lipoma. Surgical removal discussed as an option to prevent further growth. Procedure involves local anesthesia and complete removal  of the lipoma sac to prevent recurrence. - Referred to surgery for lipoma removal - Informed her that surgery will be performed in the office with local anesthesia - Advised her to expect a call to schedule the procedure        Follow up plan: Return if symptoms worsen or fail to improve. "

## 2024-10-17 ENCOUNTER — Ambulatory Visit: Payer: Self-pay | Admitting: Surgery

## 2024-10-17 ENCOUNTER — Encounter: Payer: Self-pay | Admitting: Surgery

## 2024-10-17 VITALS — BP 134/87 | HR 74 | Temp 98.1°F | Ht 62.5 in | Wt 145.8 lb

## 2024-10-17 DIAGNOSIS — D1722 Benign lipomatous neoplasm of skin and subcutaneous tissue of left arm: Secondary | ICD-10-CM

## 2024-10-17 NOTE — Patient Instructions (Addendum)
 Your Ultrasound is scheduled for 10/19/2024 1:30 pm at [facility].     Lipoma   A lipoma is a noncancerous (benign) tumor that is made up of fat cells. This is a very common type of soft-tissue growth. Lipomas are usually found under the skin (subcutaneous). They may occur in any tissue of the body that contains fat. Common areas for lipomas to appear include the back, arms, shoulders, buttocks, and thighs. Lipomas grow slowly, and they are usually painless. Most lipomas do not cause problems and do not require treatment. What are the causes? The cause of this condition is not known. What increases the risk? You are more likely to develop this condition if: You are 52-68 years old. You have a family history of lipomas. What are the signs or symptoms? A lipoma usually appears as a small, round bump under the skin. In most cases, the lump will: Feel soft or rubbery. Not cause pain or other symptoms. However, if a lipoma is located in an area where it pushes on nerves, it can become painful or cause other symptoms. How is this diagnosed? A lipoma can usually be diagnosed with a physical exam. You may also have tests to confirm the diagnosis and to rule out other conditions. Tests may include: Imaging tests, such as a CT scan or an MRI. Removal of a tissue sample to be looked at under a microscope (biopsy). How is this treated? Treatment for this condition depends on the size of the lipoma and whether it is causing any symptoms. For small lipomas that are not causing problems, no treatment is needed. If a lipoma is bigger or it causes problems, surgery may be done to remove the lipoma. Lipomas can also be removed to improve appearance. Most often, the procedure is done after applying a medicine that numbs the area (local anesthetic). Liposuction may be done to reduce the size of the lipoma before it is removed through surgery, or it may be done to remove the lipoma. Lipomas are removed  with this method to limit incision size and scarring. A liposuction tube is inserted through a small incision into the lipoma, and the contents of the lipoma are removed through the tube with suction. Follow these instructions at home: Watch your lipoma for any changes. Keep all follow-up visits. This is important. Where to find more information OrthoInfo: orthoinfo.aaos.org Contact a health care provider if: Your lipoma becomes larger or hard. Your lipoma becomes painful, red, or increasingly swollen. These could be signs of infection or a more serious condition. Get help right away if: You develop tingling or numbness in an area near the lipoma. This could indicate that the lipoma is causing nerve damage. Summary A lipoma is a noncancerous tumor that is made up of fat cells. Most lipomas do not cause problems and do not require treatment. If a lipoma is bigger or it causes problems, surgery may be done to remove the lipoma. Contact a health care provider if your lipoma becomes larger or hard, or if it becomes painful, red, or increasingly swollen. These could be signs of infection or a more serious condition. This information is not intended to replace advice given to you by your health care provider. Make sure you discuss any questions you have with your health care provider. Document Revised: 10/09/2021 Document Reviewed: 10/09/2021 Elsevier Patient Education  2024 [employer].

## 2024-10-18 ENCOUNTER — Encounter: Payer: Self-pay | Admitting: Surgery

## 2024-10-18 NOTE — Progress Notes (Signed)
 Outpatient Surgical Follow Up    MARCELA ALATORRE is an 54 y.o. female.   Chief Complaint  Patient presents with   New Patient (Initial Visit)    Left upper back lipoma    HPI: 54 year old female with a symptomatic soft tissue mass on left shoulder, she reports some intermittent discomfort on the left shoulder worsening with certain movement.  No fevers no chills she also reports some drainage at some point in time. She reports that it has increased in size over the last few months but she has had it for a while. She denies any fevers any chills no weight loss. She does smoke occasionally He also had multiple breast biopsies in the past that were benign.  Also she did have a CT scan that I personally reviewed that it was done a year ago, there was evidence of diverticulosis without any other acute intra-abdominal pathology He is able to perform more than 4 METS of activity without shortness of breath or chest pain. 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  Past Medical History:  Diagnosis Date   Hypertension    Tobacco use     Past Surgical History:  Procedure Laterality Date   BREAST BIOPSY Left 11/02/2023   us  bx 3:00 ribbon path pending   BREAST BIOPSY Left 11/02/2023   us  bx 9:00 coil path pending   BREAST BIOPSY Left 11/02/2023   us  bx 10:00 venus path pending   BREAST BIOPSY Left 11/02/2023   US  LT BREAST BX W LOC DEV 1ST LESION IMG BX SPEC US  GUIDE 11/02/2023 [facility]   BREAST BIOPSY Left 11/02/2023   US  LT BREAST BX W LOC DEV EA ADD LESION IMG BX SPEC US  GUIDE 11/02/2023 [facility]   BREAST BIOPSY Left 11/02/2023   US  LT BREAST BX W LOC DEV EA ADD LESION IMG BX SPEC US  GUIDE 11/02/2023 [facility]   COLONOSCOPY N/A 01/25/2024   Procedure: COLONOSCOPY;  Surgeon: Therisa Bi, MD;  Location: [facility] ENDOSCOPY;  Service: Endoscopy;  Laterality: N/A;   POLYPECTOMY  01/25/2024   Procedure: POLYPECTOMY, INTESTINE;  Surgeon: Therisa Bi,  MD;  Location: [facility] ENDOSCOPY;  Service: Endoscopy;;    Family History  Problem Relation Age of Onset   Breast cancer Sister 64    Social History:  reports that she has quit smoking. Her smoking use included cigarettes. She has been exposed to tobacco smoke. She has never used smokeless tobacco. She reports that she does not currently use alcohol. She reports current drug use. Drug: Marijuana.  Allergies: Allergies[1]  Medications reviewed.    ROS Full ROS performed and is otherwise negative other than what is stated in HPI   BP 134/87   Pulse 74   Temp 98.1 F (36.7 C) (Oral)   Ht 5' 2.5 (1.588 m)   Wt 145 lb 12.8 oz (66.1 kg)   SpO2 100%   BMI 26.24 kg/m   Physical Exam Vitals and nursing note reviewed. Exam conducted with a chaperone present.  Constitutional:      General: She is not in acute distress.    Appearance: Normal appearance. She is not ill-appearing.  Cardiovascular:     Rate and Rhythm: Normal rate and regular rhythm.  Pulmonary:     Effort: Pulmonary effort is normal.     Breath sounds: Normal breath sounds. No stridor.  Abdominal:     General: Abdomen is flat. There is no distension.     Palpations: Abdomen is soft. There is no  mass.     Tenderness: There is no abdominal tenderness.     Hernia: No hernia is present.  Musculoskeletal:        General: No swelling or tenderness. Normal range of motion.     Cervical back: Normal range of motion and neck supple.  Skin:    General: Skin is warm and dry.     Capillary Refill: Capillary refill takes less than 2 seconds.     Comments: 7-8 cm soft tissue mass Left shoulder, mobile, c/w lipoma  Neurological:     General: No focal deficit present.     Mental Status: She is alert and oriented to person, place, and time.  Psychiatric:        Mood and Affect: Mood normal.        Behavior: Behavior normal.        Thought Content: Thought content normal.        Judgment: Judgment normal.       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 The patient detail about her disease process I would like to obtain imaging studies to make sure this is not a potential worrisome soft tissue malignancy.  Discussed with her options of ultrasound versus CT.  I do think ultrasound will be a good screening tool and if there is concerns we will escalate to a CT scan.  Will go ahead and start workup with a ultrasound and depending on findings we will decide whether excision is appropriate to do it under local or in the OR.  I personally spent a total of 45 minutes in the care of the patient today including performing a medically appropriate exam/evaluation, counseling and educating, placing orders, referring and communicating with other health care professionals, documenting clinical information in the EHR, independently interpreting and reviewing images studies and coordinating care.   Laneta Luna, MD FACS General Surgeon    [1]  Allergies Allergen Reactions   Shellfish Allergy Anaphylaxis

## 2024-10-19 ENCOUNTER — Ambulatory Visit
Admission: RE | Admit: 2024-10-19 | Discharge: 2024-10-19 | Disposition: A | Discharge status: Home / Self Care | Source: Ambulatory Visit | Attending: Surgery | Admitting: Surgery

## 2024-10-19 ENCOUNTER — Other Ambulatory Visit: Payer: Self-pay | Admitting: Surgery

## 2024-10-19 DIAGNOSIS — D1722 Benign lipomatous neoplasm of skin and subcutaneous tissue of left arm: Secondary | ICD-10-CM

## 2024-10-31 ENCOUNTER — Other Ambulatory Visit: Payer: Self-pay

## 2024-10-31 ENCOUNTER — Encounter
Admission: RE | Admit: 2024-10-31 | Discharge: 2024-10-31 | Disposition: A | Discharge status: Home / Self Care | Source: Ambulatory Visit | Attending: Surgery | Admitting: Surgery

## 2024-10-31 ENCOUNTER — Telehealth: Payer: Self-pay | Admitting: Surgery

## 2024-10-31 ENCOUNTER — Encounter: Payer: Self-pay | Admitting: Surgery

## 2024-10-31 ENCOUNTER — Ambulatory Visit: Admitting: Surgery

## 2024-10-31 VITALS — Ht 62.5 in | Wt 151.0 lb

## 2024-10-31 VITALS — BP 143/87 | HR 75 | Ht 62.5 in | Wt 151.0 lb

## 2024-10-31 DIAGNOSIS — I10 Essential (primary) hypertension: Secondary | ICD-10-CM

## 2024-10-31 DIAGNOSIS — Z01812 Encounter for preprocedural laboratory examination: Secondary | ICD-10-CM

## 2024-10-31 DIAGNOSIS — D1722 Benign lipomatous neoplasm of skin and subcutaneous tissue of left arm: Secondary | ICD-10-CM

## 2024-10-31 DIAGNOSIS — Z01810 Encounter for preprocedural cardiovascular examination: Secondary | ICD-10-CM

## 2024-10-31 DIAGNOSIS — R229 Localized swelling, mass and lump, unspecified: Secondary | ICD-10-CM

## 2024-10-31 HISTORY — DX: Headache, unspecified: R51.9

## 2024-10-31 HISTORY — DX: Anxiety disorder, unspecified: F41.9

## 2024-10-31 HISTORY — DX: Depression, unspecified: F32.A

## 2024-10-31 NOTE — Telephone Encounter (Signed)
 Patient has been advised of Pre-Admission date/time, and Surgery date at [facility].  Surgery Date: 11/02/24 Preadmission Testing Date: 2 hrs early   Patient informed of the scheduling process and surgery information given at time of office visit.   Patient has been made aware to call [phone], between 1-3:00pm the day before surgery, to find out what time to arrive for surgery.

## 2024-10-31 NOTE — Patient Instructions (Signed)
 We have spoken today about removing a Lipoma. This will be done by Dr. Jordis at [facility].  If you are on any injectable weight loss medication, you will need to stop taking your GLP-1 injectable (weight loss) medications 8 days before your surgery to avoid any complications with anesthesia.   You will most likely be able to leave the hospital several hours after your surgery. You may need to have a drain placed after surgery, this depends on the size of your lipoma.  Plan to tentatively be off work for up to1 week following the surgery.  Please see your Blue surgery sheet for more information. Our surgery scheduler will call you to look at surgery dates and to go over information.   If you have FMLA or Disability paperwork that needs to be filled out, please have your company fax your paperwork to [phone] or you may drop this by either office. This paperwork will be filled out within 3 days after your surgery has been completed.  Lipoma Removal  Lipoma removal is a surgical procedure to remove a lipoma, which is a noncancerous (benign) tumor that is made up of fat cells. Most lipomas are small and painless and do not require treatment. They can form in many areas of the body but are most common under the skin of the back, arms, shoulders, buttocks, and thighs. You may need lipoma removal if you have a lipoma that is large, growing, or causing discomfort. Lipoma removal may also be done for cosmetic reasons. Tell a health care provider about: Any allergies you have. All medicines you are taking, including vitamins, herbs, eye drops, creams, and over-the-counter medicines. Any problems you or family members have had with anesthetic medicines. Any bleeding problems you have. Any surgeries you have had. Any medical conditions you have. Whether you are pregnant or may be pregnant. What are the risks? Generally, this is a safe procedure. However, problems may occur,  including: Infection. Bleeding. Scarring. Allergic reactions to medicines. Damage to nearby structures or organs, such as damage to nerves or blood vessels near the lipoma. What happens before the procedure? Medicines Ask your health care provider about: Changing or stopping your regular medicines. This is especially important if you are taking diabetes medicines or blood thinners. Taking medicines such as aspirin and ibuprofen . These medicines can thin your blood. Do not take these medicines unless your health care provider tells you to take them. Taking over-the-counter medicines, vitamins, herbs, and supplements. General instructions You will have a physical exam. Your health care provider will check the size of the lipoma and whether it can be removed easily. You may have a biopsy and imaging tests, such as X-rays, a CT scan, and an MRI. Do not use any products that contain nicotine or tobacco for at least 4 weeks before the procedure. These products include cigarettes, chewing tobacco, and vaping devices, such as e-cigarettes. If you need help quitting, ask your health care provider. Ask your health care provider: How your surgery site will be marked. What steps will be taken to help prevent infection. These may include: Washing skin with a germ-killing soap. Taking antibiotic medicine. If you will be going home right after the procedure, plan to have a responsible adult: Take you home from the hospital or clinic. You will not be allowed to drive. Care for you for the time you are told. What happens during the procedure?  An IV will be inserted into one of your veins. You will  be given one or more of the following: A medicine to help you relax (sedative). A medicine to numb the area (local anesthetic). A medicine to make you fall asleep (general anesthetic). A medicine that is injected into an area of your body to numb everything below the injection site (regional anesthetic). An  incision will be made into the skin over the lipoma or very near the lipoma. The incision may be made in a natural skin line or crease. Tissues, nerves, and blood vessels near the lipoma will be moved out of the way. The lipoma and the capsule that surrounds it will be separated from the surrounding tissues. The lipoma will be removed. You may have a drain placed depending on the size of your lipoma The incision may be closed with stitches (sutures). A bandage (dressing) will be placed over the incision. The procedure may vary among health care providers and hospitals. What happens after the procedure? Your blood pressure, heart rate, breathing rate, and blood oxygen  level will be monitored until you leave the hospital or clinic. If you were prescribed an antibiotic medicine, use it as told by your health care provider. Do not stop using the antibiotic even if you start to feel better. If you were given a sedative during the procedure, it can affect you for several hours. Do not drive or operate machinery until your health care provider says that it is safe. Where to find more information OrthoInfo: orthoinfo.aaos.org Summary Before the procedure, follow instructions from your health care provider about eating and drinking, and changing or stopping your regular medicines. This is especially important if you are taking diabetes medicines or blood thinners. After the lipoma is removed, the incision may be closed with stitches (sutures) and covered with a bandage (dressing). If you were given a sedative during the procedure, it can affect you for several hours. Do not drive or operate machinery until your health care provider says that it is safe.

## 2024-10-31 NOTE — Patient Instructions (Addendum)
 Your procedure is scheduled on:  FRIDAY  JANUARY 30  Report to the Registration Desk on the 1st floor of the [facility] To find out your arrival time, please call [phone] between 1PM - 3PM on:  THURSDAY  JANUARY 29  If your arrival time is 6:00 am, do not arrive before that time as the [facility] entrance doors do not open until 6:00 am.  REMEMBER: Instructions that are not followed completely may result in serious medical risk, up to and including death; or upon the discretion of your surgeon and anesthesiologist your surgery may need to be rescheduled.  Do not eat food after midnight the night before surgery.  No gum chewing or hard candies.  You may however, drink CLEAR liquids up to 2 hours before you are scheduled to arrive for your surgery. Do not drink anything within 2 hours of your scheduled arrival time.  Clear liquids include: - water  - apple juice without pulp - gatorade (not RED colors) - black coffee or tea (Do NOT add milk or creamers to the coffee or tea) Do NOT drink anything that is not on this list.  One week prior to surgery: Stop Anti-inflammatories (NSAIDS) such as Advil, Aleve, Ibuprofen, Motrin, Naproxen, Naprosyn and Aspirin based products such as Excedrin, Goody's Powder, BC Powder. You may take Tylenol  if needed for pain up until the day of surgery.  Stop ANY OVER THE COUNTER supplements until after surgery.  Meloxicam (Mobic) hold until after surgery  ON THE MORNING OF SURGERY DO NOT TAKE ANY MEDICATION   No Alcohol for 24 hours before or after surgery.  Do not use any recreational drugs for at least a week (preferably 2 weeks) before your surgery.  Please be advised that the combination of cocaine and anesthesia may have negative outcomes, up to and including death. If you test positive for cocaine, your surgery will be cancelled.  On the morning of surgery brush your teeth with toothpaste and water, you may rinse your mouth with  mouthwash if you wish. Do not swallow any toothpaste or mouthwash.  Use antibacterial soap ( e.g. Dial) the night before your procedure  Do not wear jewelry, make-up, hairpins, clips or nail polish.  For welded (permanent) jewelry: bracelets, anklets, waist bands, etc.  Please have this removed prior to surgery.  If it is not removed, there is a chance that hospital personnel will need to cut it off on the day of surgery.  Do not wear lotions, powders, or perfumes.   Do not shave body hair from the neck down 48 hours before surgery.  Contact lenses, hearing aids and dentures may not be worn into surgery.  Do not bring valuables to the hospital. [facility] is not responsible for any missing/lost belongings or valuables.   Notify your doctor if there is any change in your medical condition (cold, fever, infection).  Wear comfortable clothing (specific to your surgery type) to the hospital.  After surgery, you can help prevent lung complications by doing breathing exercises.  Take deep breaths and cough every 1-2 hours.   If you are being discharged the day of surgery, you will not be allowed to drive home. You will need a responsible individual to drive you home and stay with you for 24 hours after surgery.   If you are taking public transportation, you will need to have a responsible individual with you.  Please call the Pre-admissions Testing Dept. at [phone] if you have any  questions about these instructions.  Surgery Visitation Policy:  Patients having surgery or a procedure may have two visitors.  Children under the age of 1 must have an adult with them who is not the patient.  Merchandiser, Retail to address health-related social needs:  [email or web address]                                                                                                             Preparing for Surgery with CHLORHEXIDINE  GLUCONATE (CHG) Soap  Chlorhexidine   Gluconate (CHG) Soap  o An antiseptic cleaner that kills germs and bonds with the skin to continue killing germs even after washing  o Used for showering the night before surgery and morning of surgery  Before surgery, you can play an important role by reducing the number of germs on your skin.  CHG (Chlorhexidine  gluconate) soap is an antiseptic cleanser which kills germs and bonds with the skin to continue killing germs even after washing.  Please do not use if you have an allergy to CHG or antibacterial soaps. If your skin becomes reddened/irritated stop using the CHG.  1. Shower the NIGHT BEFORE SURGERY with CHG soap.  2. If you choose to wash your hair, wash your hair first as usual with your normal shampoo.  3. After shampooing, rinse your hair and body thoroughly to remove the shampoo.  4. Use CHG as you would any other liquid soap. You can apply CHG directly to the skin and wash gently with a clean washcloth.  5. Apply the CHG soap to your body only from the neck down. Do not use on open wounds or open sores. Avoid contact with your eyes, ears, mouth, and genitals (private parts). Wash face and genitals (private parts) with your normal soap.  6. Wash thoroughly, paying special attention to the area where your surgery will be performed.  7. Thoroughly rinse your body with warm water.  8. Do not shower/wash with your normal soap after using and rinsing off the CHG soap.  9. Do not use lotions, oils, etc., after showering with CHG.  10. Pat yourself dry with a clean towel.  11. Wear clean pajamas to bed the night before surgery.  12. Place clean sheets on your bed the night of your shower and do not sleep with pets.  13. Do not apply any deodorants/lotions/powders.  14. Please wear clean clothes to the hospital.  15. Remember to brush your teeth with your regular toothpaste.

## 2024-10-31 NOTE — H&P (View-Only) (Signed)
 Outpatient Surgical Follow Up  10/31/2024  Angela Cain is an 54 y.o. female.   Chief Complaint  Patient presents with   Follow-up    HPI: 54 year old female with a symptomatic soft tissue mass on left shoulder, she reports some intermittent discomfort on the left shoulder worsening with certain movement.  No fevers no chills she also reports some drainage at some point in time. She reports that it has increased in size over the last few months but she has had it for a while. She denies any fevers any chills no weight loss. She does smoke occasionally She did have an U/S that I personally reviewed mass not clearly seen, on exam it is obvious and evidence that she has a soft tissue mass c/w lipoma. SHe is able to perform more than 4 METS of activity without shortness of breath or chest pain. 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  Past Medical History:  Diagnosis Date   Hypertension    Tobacco use     Past Surgical History:  Procedure Laterality Date   BREAST BIOPSY Left 11/02/2023   us  bx 3:00 ribbon path pending   BREAST BIOPSY Left 11/02/2023   us  bx 9:00 coil path pending   BREAST BIOPSY Left 11/02/2023   us  bx 10:00 venus path pending   BREAST BIOPSY Left 11/02/2023   US  LT BREAST BX W LOC DEV 1ST LESION IMG BX SPEC US  GUIDE 11/02/2023 [facility]   BREAST BIOPSY Left 11/02/2023   US  LT BREAST BX W LOC DEV EA ADD LESION IMG BX SPEC US  GUIDE 11/02/2023 [facility]   BREAST BIOPSY Left 11/02/2023   US  LT BREAST BX W LOC DEV EA ADD LESION IMG BX SPEC US  GUIDE 11/02/2023 [facility]   COLONOSCOPY N/A 01/25/2024   Procedure: COLONOSCOPY;  Surgeon: Therisa Bi, MD;  Location: [facility] ENDOSCOPY;  Service: Endoscopy;  Laterality: N/A;   POLYPECTOMY  01/25/2024   Procedure: POLYPECTOMY, INTESTINE;  Surgeon: Therisa Bi, MD;  Location: [facility] ENDOSCOPY;  Service: Endoscopy;;    Family History  Problem Relation Age of Onset   Breast cancer  Sister 36    Social History:  reports that she has quit smoking. Her smoking use included cigarettes. She has been exposed to tobacco smoke. She has never used smokeless tobacco. She reports that she does not currently use alcohol. She reports current drug use. Drug: Marijuana.  Allergies: Allergies[1]  Medications reviewed.    ROS Full ROS performed and is otherwise negative other than what is stated in HPI   BP (!) 143/87   Pulse 75   Ht 5' 2.5 (1.588 m)   Wt 151 lb (68.5 kg)   SpO2 98%   BMI 27.18 kg/m   Physical Exam Vitals and nursing note reviewed. Exam conducted with a chaperone present.  Constitutional:      General: She is not in acute distress.    Appearance: Normal appearance. She is not ill-appearing.  Cardiovascular:     Rate and Rhythm: Normal rate and regular rhythm.  Pulmonary:     Effort: Pulmonary effort is normal.     Breath sounds: Normal breath sounds. No stridor.  Abdominal:     General: Abdomen is flat. There is no distension.     Palpations: Abdomen is soft. There is no mass.     Tenderness: There is no abdominal tenderness.     Hernia: No hernia is present.  Musculoskeletal:        General: No swelling  or tenderness. Normal range of motion.     Cervical back: Normal range of motion and neck supple.  Skin:    General: Skin is warm and dry.     Capillary Refill: Capillary refill takes less than 2 seconds.     Comments: 7-8 cm soft tissue mass Left shoulder, mobile, c/w lipoma  Neurological:     General: No focal deficit present.     Mental Status: She is alert and oriented to person, place, and time.  Psychiatric:        Mood and Affect: Mood normal.        Behavior: Behavior normal.        Thought Content: Thought content normal.        Judgment: Judgment normal.   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 Discussed with The patient detail about her disease process y.  Discussed with her  options of ultrasound results and potential doing CT vs excision.  She wishes to move forward with excision and wishes to do this in the OR due to anxiety and pain issues. Procedure d/w her in detail, risks, benefits and possible complications including but not limited to bleeding , infection, nerve injuries, recurrences seromas. She understands and wishes to proceed. I personally spent a total of 40 minutes in the care of the patient today including performing a medically appropriate exam/evaluation, counseling and educating, placing orders, referring and communicating with other health care professionals, documenting clinical information in the EHR, independently interpreting and reviewing images studies and coordinating care.   Laneta Luna, MD FACS General Surgeon    [1]  Allergies Allergen Reactions   Shellfish Allergy Anaphylaxis

## 2024-10-31 NOTE — Progress Notes (Signed)
 Outpatient Surgical Follow Up  10/31/2024  Angela Cain is an 54 y.o. female.   Chief Complaint  Patient presents with   Follow-up    HPI: 54 year old female with a symptomatic soft tissue mass on left shoulder, she reports some intermittent discomfort on the left shoulder worsening with certain movement.  No fevers no chills she also reports some drainage at some point in time. She reports that it has increased in size over the last few months but she has had it for a while. She denies any fevers any chills no weight loss. She does smoke occasionally She did have an U/S that I personally reviewed mass not clearly seen, on exam it is obvious and evidence that she has a soft tissue mass c/w lipoma. SHe is able to perform more than 4 METS of activity without shortness of breath or chest pain. 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  Past Medical History:  Diagnosis Date   Hypertension    Tobacco use     Past Surgical History:  Procedure Laterality Date   BREAST BIOPSY Left 11/02/2023   us  bx 3:00 ribbon path pending   BREAST BIOPSY Left 11/02/2023   us  bx 9:00 coil path pending   BREAST BIOPSY Left 11/02/2023   us  bx 10:00 venus path pending   BREAST BIOPSY Left 11/02/2023   US  LT BREAST BX W LOC DEV 1ST LESION IMG BX SPEC US  GUIDE 11/02/2023 [facility]   BREAST BIOPSY Left 11/02/2023   US  LT BREAST BX W LOC DEV EA ADD LESION IMG BX SPEC US  GUIDE 11/02/2023 [facility]   BREAST BIOPSY Left 11/02/2023   US  LT BREAST BX W LOC DEV EA ADD LESION IMG BX SPEC US  GUIDE 11/02/2023 [facility]   COLONOSCOPY N/A 01/25/2024   Procedure: COLONOSCOPY;  Surgeon: Therisa Bi, MD;  Location: [facility] ENDOSCOPY;  Service: Endoscopy;  Laterality: N/A;   POLYPECTOMY  01/25/2024   Procedure: POLYPECTOMY, INTESTINE;  Surgeon: Therisa Bi, MD;  Location: [facility] ENDOSCOPY;  Service: Endoscopy;;    Family History  Problem Relation Age of Onset   Breast cancer  Sister 36    Social History:  reports that she has quit smoking. Her smoking use included cigarettes. She has been exposed to tobacco smoke. She has never used smokeless tobacco. She reports that she does not currently use alcohol. She reports current drug use. Drug: Marijuana.  Allergies: Allergies[1]  Medications reviewed.    ROS Full ROS performed and is otherwise negative other than what is stated in HPI   BP (!) 143/87   Pulse 75   Ht 5' 2.5 (1.588 m)   Wt 151 lb (68.5 kg)   SpO2 98%   BMI 27.18 kg/m   Physical Exam Vitals and nursing note reviewed. Exam conducted with a chaperone present.  Constitutional:      General: She is not in acute distress.    Appearance: Normal appearance. She is not ill-appearing.  Cardiovascular:     Rate and Rhythm: Normal rate and regular rhythm.  Pulmonary:     Effort: Pulmonary effort is normal.     Breath sounds: Normal breath sounds. No stridor.  Abdominal:     General: Abdomen is flat. There is no distension.     Palpations: Abdomen is soft. There is no mass.     Tenderness: There is no abdominal tenderness.     Hernia: No hernia is present.  Musculoskeletal:        General: No swelling  or tenderness. Normal range of motion.     Cervical back: Normal range of motion and neck supple.  Skin:    General: Skin is warm and dry.     Capillary Refill: Capillary refill takes less than 2 seconds.     Comments: 7-8 cm soft tissue mass Left shoulder, mobile, c/w lipoma  Neurological:     General: No focal deficit present.     Mental Status: She is alert and oriented to person, place, and time.  Psychiatric:        Mood and Affect: Mood normal.        Behavior: Behavior normal.        Thought Content: Thought content normal.        Judgment: Judgment normal.   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 Discussed with The patient detail about her disease process y.  Discussed with her  options of ultrasound results and potential doing CT vs excision.  She wishes to move forward with excision and wishes to do this in the OR due to anxiety and pain issues. Procedure d/w her in detail, risks, benefits and possible complications including but not limited to bleeding , infection, nerve injuries, recurrences seromas. She understands and wishes to proceed. I personally spent a total of 40 minutes in the care of the patient today including performing a medically appropriate exam/evaluation, counseling and educating, placing orders, referring and communicating with other health care professionals, documenting clinical information in the EHR, independently interpreting and reviewing images studies and coordinating care.   Laneta Luna, MD FACS General Surgeon    [1]  Allergies Allergen Reactions   Shellfish Allergy Anaphylaxis

## 2024-11-01 ENCOUNTER — Encounter
Admission: RE | Admit: 2024-11-01 | Discharge: 2024-11-01 | Disposition: A | Discharge status: Home / Self Care | Source: Ambulatory Visit | Attending: Surgery | Admitting: Surgery

## 2024-11-01 DIAGNOSIS — Z01812 Encounter for preprocedural laboratory examination: Secondary | ICD-10-CM

## 2024-11-01 DIAGNOSIS — Z01818 Encounter for other preprocedural examination: Secondary | ICD-10-CM | POA: Diagnosis present

## 2024-11-01 DIAGNOSIS — I10 Essential (primary) hypertension: Secondary | ICD-10-CM | POA: Insufficient documentation

## 2024-11-01 DIAGNOSIS — Z01810 Encounter for preprocedural cardiovascular examination: Secondary | ICD-10-CM

## 2024-11-01 LAB — BASIC METABOLIC PANEL WITH GFR
Anion gap: 12 (ref 5–15)
BUN: 14 mg/dL (ref 6–20)
CO2: 25 mmol/L (ref 22–32)
Calcium: 9.4 mg/dL (ref 8.9–10.3)
Chloride: 105 mmol/L (ref 98–111)
Creatinine, Ser: 0.7 mg/dL (ref 0.44–1.00)
GFR, Estimated: >60 mL/min
Glucose, Bld: 75 mg/dL (ref 70–99)
Potassium: 3.7 mmol/L (ref 3.5–5.1)
Sodium: 143 mmol/L (ref 135–145)

## 2024-11-01 LAB — CBC
HCT: 42.6 % (ref 36.0–46.0)
Hemoglobin: 14.1 g/dL (ref 12.0–15.0)
MCH: 29 pg (ref 26.0–34.0)
MCHC: 33.1 g/dL (ref 30.0–36.0)
MCV: 87.7 fL (ref 80.0–100.0)
Platelets: 292 10*3/uL (ref 150–400)
RBC: 4.86 MIL/uL (ref 3.87–5.11)
RDW: 14.6 % (ref 11.5–15.5)
WBC: 6.4 10*3/uL (ref 4.0–10.5)
nRBC: 0 % (ref 0.0–0.2)

## 2024-11-01 MED ORDER — LACTATED RINGERS IV SOLN: INTRAVENOUS | Status: DC

## 2024-11-01 MED ORDER — GABAPENTIN 300 MG PO CAPS
300.0000 mg | ORAL_CAPSULE | ORAL | Status: AC
  Administered 2024-11-02: 300 mg via ORAL

## 2024-11-01 MED ORDER — ACETAMINOPHEN 500 MG PO TABS
1000.0000 mg | ORAL_TABLET | ORAL | Status: AC
  Administered 2024-11-02: 1000 mg via ORAL

## 2024-11-01 MED ORDER — CHLORHEXIDINE GLUCONATE CLOTH 2 % EX PADS
6.0000 | MEDICATED_PAD | Freq: Once | CUTANEOUS | Status: AC
  Administered 2024-11-02: 6 via TOPICAL

## 2024-11-01 MED ORDER — CHLORHEXIDINE GLUCONATE CLOTH 2 % EX PADS: 6.0000 | MEDICATED_PAD | Freq: Once | CUTANEOUS | Status: DC

## 2024-11-01 MED ORDER — CEFAZOLIN SODIUM-DEXTROSE 2-4 GM/100ML-% IV SOLN
2.0000 g | INTRAVENOUS | Status: AC
  Administered 2024-11-02: 2 g via INTRAVENOUS

## 2024-11-01 MED ORDER — ORAL CARE MOUTH RINSE: 15.0000 mL | Freq: Once | OROMUCOSAL | Status: AC

## 2024-11-01 MED ORDER — CHLORHEXIDINE GLUCONATE 0.12 % MT SOLN
15.0000 mL | Freq: Once | OROMUCOSAL | Status: AC
  Administered 2024-11-02: 15 mL via OROMUCOSAL

## 2024-11-02 ENCOUNTER — Ambulatory Visit: Admitting: Certified Registered Nurse Anesthetist

## 2024-11-02 ENCOUNTER — Encounter: Payer: Self-pay | Admitting: Surgery

## 2024-11-02 ENCOUNTER — Encounter
Admission: RE | Disposition: A | Discharge status: Home / Self Care | Payer: Self-pay | Source: Home / Self Care | Attending: Surgery

## 2024-11-02 ENCOUNTER — Other Ambulatory Visit: Payer: Self-pay

## 2024-11-02 ENCOUNTER — Ambulatory Visit
Admission: RE | Admit: 2024-11-02 | Discharge: 2024-11-02 | Disposition: A | Discharge status: Home / Self Care | Attending: Surgery | Admitting: Surgery

## 2024-11-02 ENCOUNTER — Ambulatory Visit: Payer: Self-pay | Admitting: Urgent Care

## 2024-11-02 DIAGNOSIS — F17200 Nicotine dependence, unspecified, uncomplicated: Secondary | ICD-10-CM | POA: Insufficient documentation

## 2024-11-02 DIAGNOSIS — D1722 Benign lipomatous neoplasm of skin and subcutaneous tissue of left arm: Secondary | ICD-10-CM | POA: Diagnosis present

## 2024-11-02 DIAGNOSIS — F32A Depression, unspecified: Secondary | ICD-10-CM | POA: Diagnosis not present

## 2024-11-02 DIAGNOSIS — F419 Anxiety disorder, unspecified: Secondary | ICD-10-CM | POA: Diagnosis not present

## 2024-11-02 DIAGNOSIS — I10 Essential (primary) hypertension: Secondary | ICD-10-CM | POA: Insufficient documentation

## 2024-11-02 MED ORDER — BUPIVACAINE-EPINEPHRINE (PF) 0.25% -1:200000 IJ SOLN
INTRAMUSCULAR | Status: AC
  Filled 2024-11-02: qty 30

## 2024-11-02 MED ORDER — DEXAMETHASONE SOD PHOSPHATE PF 10 MG/ML IJ SOLN
INTRAMUSCULAR | Status: DC | PRN
  Administered 2024-11-02: 10 mg via INTRAVENOUS

## 2024-11-02 MED ORDER — LIDOCAINE HCL (PF) 2 % IJ SOLN
INTRAMUSCULAR | Status: AC
  Filled 2024-11-02: qty 5

## 2024-11-02 MED ORDER — ONDANSETRON HCL 4 MG/2ML IJ SOLN
INTRAMUSCULAR | Status: DC | PRN
  Administered 2024-11-02: 4 mg via INTRAVENOUS

## 2024-11-02 MED ORDER — CHLORHEXIDINE GLUCONATE 0.12 % MT SOLN
OROMUCOSAL | Status: AC
  Filled 2024-11-02: qty 15

## 2024-11-02 MED ORDER — BUPIVACAINE-EPINEPHRINE (PF) 0.25% -1:200000 IJ SOLN
INTRAMUSCULAR | Status: DC | PRN
  Administered 2024-11-02: 30 mL

## 2024-11-02 MED ORDER — PROPOFOL 1000 MG/100ML IV EMUL
INTRAVENOUS | Status: AC
  Filled 2024-11-02: qty 100

## 2024-11-02 MED ORDER — HYDROCODONE-ACETAMINOPHEN 5-325 MG PO TABS: 1.0000 | ORAL_TABLET | Freq: Four times a day (QID) | ORAL | 0 refills | Status: AC | PRN

## 2024-11-02 MED ORDER — CEFAZOLIN SODIUM-DEXTROSE 2-4 GM/100ML-% IV SOLN
INTRAVENOUS | Status: AC
  Filled 2024-11-02: qty 100

## 2024-11-02 MED ORDER — PROPOFOL 500 MG/50ML IV EMUL
INTRAVENOUS | Status: DC | PRN
  Administered 2024-11-02: 125 ug/kg/min via INTRAVENOUS

## 2024-11-02 MED ORDER — FENTANYL CITRATE (PF) 100 MCG/2ML IJ SOLN
INTRAMUSCULAR | Status: DC | PRN
  Administered 2024-11-02: 50 ug via INTRAVENOUS

## 2024-11-02 MED ORDER — LIDOCAINE HCL (PF) 2 % IJ SOLN
INTRAMUSCULAR | Status: DC | PRN
  Administered 2024-11-02: 80 mg via INTRADERMAL

## 2024-11-02 MED ORDER — 0.9 % SODIUM CHLORIDE (POUR BTL) OPTIME
TOPICAL | Status: DC | PRN
  Administered 2024-11-02: 500 mL

## 2024-11-02 MED ORDER — ACETAMINOPHEN 500 MG PO TABS
ORAL_TABLET | ORAL | Status: AC
  Filled 2024-11-02: qty 1

## 2024-11-02 MED ORDER — GABAPENTIN 300 MG PO CAPS
ORAL_CAPSULE | ORAL | Status: AC
  Filled 2024-11-02: qty 1

## 2024-11-02 MED ORDER — MIDAZOLAM HCL (PF) 2 MG/2ML IJ SOLN
INTRAMUSCULAR | Status: DC | PRN
  Administered 2024-11-02: 2 mg via INTRAVENOUS

## 2024-11-02 MED ORDER — FENTANYL CITRATE (PF) 100 MCG/2ML IJ SOLN
INTRAMUSCULAR | Status: AC
  Filled 2024-11-02: qty 2

## 2024-11-02 MED ORDER — MIDAZOLAM HCL 2 MG/2ML IJ SOLN
INTRAMUSCULAR | Status: AC
  Filled 2024-11-02: qty 2

## 2024-11-02 NOTE — Transfer of Care (Signed)
 Immediate Anesthesia Transfer of Care Note  Patient: Angela Cain  Procedure(s) Performed: EXCISION, MASS, UPPER EXTREMITY (Left: Shoulder)  Patient Location: PACU  Anesthesia Type:General  Level of Consciousness: sedated and drowsy  Airway & Oxygen Therapy: Patient Spontanous Breathing and Patient connected to face mask oxygen  Post-op Assessment: Report given to RN and Post -op Vital signs reviewed and stable  Post vital signs: Reviewed and stable  Last Vitals:  Vitals Value Taken Time  BP 112/77 11/02/24 10:11  Temp    Pulse 65 11/02/24 10:11  Resp 12 11/02/24 10:11  SpO2 100 % 11/02/24 10:11  Vitals shown include unfiled device data.  Last Pain:  Vitals:   11/02/24 0818  TempSrc: Temporal         Complications: No notable events documented.

## 2024-11-02 NOTE — Anesthesia Procedure Notes (Signed)
 Date/Time: 11/02/2024 9:08 AM  Performed by: Dominica Krabbe, CRNAPre-anesthesia Checklist: Patient identified, Emergency Drugs available, Suction available, Timeout performed and Patient being monitored Patient Re-evaluated:Patient Re-evaluated prior to induction Oxygen Delivery Method: Simple face mask Preoxygenation: Pre-oxygenation with 100% oxygen Induction Type: IV induction

## 2024-11-02 NOTE — Interval H&P Note (Signed)
 History and Physical Interval Note:  11/02/2024 9:09 AM  Angela Cain  has presented today for surgery, with the diagnosis of lipoma left shoulder.  The various methods of treatment have been discussed with the patient and family. After consideration of risks, benefits and other options for treatment, the patient has consented to  Procedures: EXCISION, MASS, UPPER EXTREMITY (Left) as a surgical intervention.  The patient's history has been reviewed, patient examined, no change in status, stable for surgery.  I have reviewed the patient's chart and labs.  Questions were answered to the patient's satisfaction.     [name] F Donell Sliwinski

## 2024-11-02 NOTE — Anesthesia Preprocedure Evaluation (Signed)
"                                    Anesthesia Evaluation  Patient identified by MRN, date of birth, ID band Patient awake    Reviewed: Allergy & Precautions, H&P , NPO status , Patient's Chart, lab work & pertinent test results, reviewed documented beta blocker date and time   History of Anesthesia Complications Negative for: history of anesthetic complications  Airway Mallampati: III  TM Distance: >3 FB Neck ROM: full    Dental  (+) Dental Advidsory Given, Missing, Chipped   Pulmonary neg pulmonary ROS, former smoker   Pulmonary exam normal breath sounds clear to auscultation       Cardiovascular Exercise Tolerance: Good hypertension, (-) angina (-) Past MI and (-) Cardiac Stents Normal cardiovascular exam(-) dysrhythmias (-) Valvular Problems/Murmurs Rhythm:regular Rate:Normal     Neuro/Psych  PSYCHIATRIC DISORDERS Anxiety Depression    negative neurological ROS     GI/Hepatic negative GI ROS,,,(+) neg Cirrhosis    substance abuse  marijuana use  Endo/Other  negative endocrine ROS    Renal/GU negative Renal ROS  negative genitourinary   Musculoskeletal   Abdominal   Peds  Hematology negative hematology ROS (+)   Anesthesia Other Findings Past Medical History: No date: Hypertension No date: Tobacco use   Reproductive/Obstetrics negative OB ROS                              Anesthesia Physical Anesthesia Plan  ASA: 2  Anesthesia Plan: General   Post-op Pain Management:    Induction: Intravenous  PONV Risk Score and Plan: 3 and Propofol  infusion, TIVA and Treatment may vary due to age or medical condition  Airway Management Planned: Natural Airway and Simple Face Mask  Additional Equipment:   Intra-op Plan:   Post-operative Plan:   Informed Consent: I have reviewed the patients History and Physical, chart, labs and discussed the procedure including the risks, benefits and alternatives for the proposed  anesthesia with the patient or authorized representative who has indicated his/her understanding and acceptance.     Dental Advisory Given  Plan Discussed with: Anesthesiologist, CRNA and Surgeon  Anesthesia Plan Comments:         Anesthesia Quick Evaluation  "

## 2024-11-02 NOTE — Op Note (Signed)
 DIAGNOSIS Symptomatic soft tissue mass Left shoulder  PROCEDURES 1.  Excision of lipoma deep intramuscular measuring 7 cms  ANESTHESIA: MAC + 0.25% marcaine  with epinephrine   EBL: Minimal  FINDINGS: Lipoma deep intramuscular  Pt was placed decubitus position with all pressure points padded and Left side up. SHe was prepped and draped in the usual sterile fashion.  Local was injected over the area of interest.  15 blade knife used to create an incision and the subcutaneous tissue was dissected free with hemostats.   The mass was deep and fascia incised, The mass was intramuscular, mass was dissected free from adjacent structures using electrocautery.  It was sent for permanent pathology.  Hemostasis obtained with cautery.  Fascia closed with 2-0 vicryl. The soft tissue was closed in a 2 layer fashion with dermal layer using interrupted 3-0 Vicryl.  The skin was closed in a subcuticular fashion using 4-0 Monocryl.  Dermabond was applied.  No complications. The  patient tolerated procedure well

## 2024-11-05 ENCOUNTER — Encounter: Payer: Self-pay | Admitting: Surgery

## 2024-11-05 LAB — SURGICAL PATHOLOGY

## 2024-11-07 ENCOUNTER — Encounter: Admitting: Surgery

## 2024-11-08 ENCOUNTER — Ambulatory Visit: Admitting: Internal Medicine

## 2024-11-21 ENCOUNTER — Encounter: Admitting: Surgery
